# Patient Record
Sex: Female | Born: 1995 | Race: White | Hispanic: No | Marital: Married | Smoking: Former smoker
Health system: Southern US, Community
[De-identification: ages and names within clinical notes are randomized; demographics above are authoritative.]

## PROBLEM LIST (undated history)

## (undated) ENCOUNTER — Inpatient Hospital Stay (HOSPITAL_COMMUNITY): Payer: Self-pay

## (undated) DIAGNOSIS — D580 Hereditary spherocytosis: Secondary | ICD-10-CM

## (undated) DIAGNOSIS — Z9289 Personal history of other medical treatment: Secondary | ICD-10-CM

## (undated) DIAGNOSIS — D649 Anemia, unspecified: Secondary | ICD-10-CM

## (undated) HISTORY — PX: WISDOM TOOTH EXTRACTION: SHX21

---
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
## 2014-09-18 ENCOUNTER — Emergency Department (HOSPITAL_BASED_OUTPATIENT_CLINIC_OR_DEPARTMENT_OTHER)
Admission: EM | Admit: 2014-09-18 | Discharge: 2014-09-18 | Disposition: A | Discharge status: Home / Self Care | Payer: BLUE CROSS/BLUE SHIELD | Attending: Emergency Medicine | Admitting: Emergency Medicine

## 2014-09-18 ENCOUNTER — Encounter (HOSPITAL_BASED_OUTPATIENT_CLINIC_OR_DEPARTMENT_OTHER): Payer: Self-pay | Admitting: *Deleted

## 2014-09-18 ENCOUNTER — Emergency Department (HOSPITAL_BASED_OUTPATIENT_CLINIC_OR_DEPARTMENT_OTHER): Payer: BLUE CROSS/BLUE SHIELD

## 2014-09-18 DIAGNOSIS — J45909 Unspecified asthma, uncomplicated: Secondary | ICD-10-CM | POA: Insufficient documentation

## 2014-09-18 DIAGNOSIS — R1012 Left upper quadrant pain: Secondary | ICD-10-CM | POA: Diagnosis present

## 2014-09-18 DIAGNOSIS — Z79899 Other long term (current) drug therapy: Secondary | ICD-10-CM | POA: Diagnosis not present

## 2014-09-18 DIAGNOSIS — R1110 Vomiting, unspecified: Secondary | ICD-10-CM | POA: Diagnosis not present

## 2014-09-18 DIAGNOSIS — Z720 Tobacco use: Secondary | ICD-10-CM | POA: Insufficient documentation

## 2014-09-18 DIAGNOSIS — D580 Hereditary spherocytosis: Secondary | ICD-10-CM | POA: Diagnosis not present

## 2014-09-18 DIAGNOSIS — Z3202 Encounter for pregnancy test, result negative: Secondary | ICD-10-CM | POA: Diagnosis not present

## 2014-09-18 LAB — URINALYSIS, ROUTINE W REFLEX MICROSCOPIC
BILIRUBIN URINE: NEGATIVE
Glucose, UA: NEGATIVE mg/dL
Hgb urine dipstick: NEGATIVE
Ketones, ur: NEGATIVE mg/dL
LEUKOCYTES UA: NEGATIVE
Nitrite: NEGATIVE
Protein, ur: NEGATIVE mg/dL
SPECIFIC GRAVITY, URINE: 1.017 (ref 1.005–1.030)
UROBILINOGEN UA: 1 mg/dL (ref 0.0–1.0)
pH: 7 (ref 5.0–8.0)

## 2014-09-18 LAB — CBC WITH DIFFERENTIAL/PLATELET
BASOS PCT: 1 % (ref 0–1)
Basophils Absolute: 0 10*3/uL (ref 0.0–0.1)
EOS ABS: 0.2 10*3/uL (ref 0.0–0.7)
Eosinophils Relative: 2 % (ref 0–5)
HCT: 30.8 % — ABNORMAL LOW (ref 36.0–46.0)
Hemoglobin: 11.1 g/dL — ABNORMAL LOW (ref 12.0–15.0)
LYMPHS ABS: 1.1 10*3/uL (ref 0.7–4.0)
Lymphocytes Relative: 14 % (ref 12–46)
MCH: 33.9 pg (ref 26.0–34.0)
MCHC: 36 g/dL (ref 30.0–36.0)
MCV: 94.2 fL (ref 78.0–100.0)
MONOS PCT: 11 % (ref 3–12)
Monocytes Absolute: 0.9 10*3/uL (ref 0.1–1.0)
Neutro Abs: 5.5 10*3/uL (ref 1.7–7.7)
Neutrophils Relative %: 72 % (ref 43–77)
PLATELETS: 235 10*3/uL (ref 150–400)
RBC: 3.27 MIL/uL — AB (ref 3.87–5.11)
RDW: 15.3 % (ref 11.5–15.5)
WBC: 7.6 10*3/uL (ref 4.0–10.5)

## 2014-09-18 LAB — COMPREHENSIVE METABOLIC PANEL
ALBUMIN: 4.8 g/dL (ref 3.5–5.0)
ALK PHOS: 62 U/L (ref 38–126)
ALT: 14 U/L (ref 14–54)
AST: 20 U/L (ref 15–41)
Anion gap: 8 (ref 5–15)
BUN: 13 mg/dL (ref 6–20)
CHLORIDE: 105 mmol/L (ref 101–111)
CO2: 26 mmol/L (ref 22–32)
CREATININE: 0.71 mg/dL (ref 0.44–1.00)
Calcium: 9.1 mg/dL (ref 8.9–10.3)
GFR calc Af Amer: >60 mL/min (ref >60)
GLUCOSE: 115 mg/dL — AB (ref 65–99)
POTASSIUM: 3.2 mmol/L — AB (ref 3.5–5.1)
Sodium: 139 mmol/L (ref 135–145)
TOTAL PROTEIN: 7.3 g/dL (ref 6.5–8.1)
Total Bilirubin: 4.8 mg/dL — ABNORMAL HIGH (ref 0.3–1.2)

## 2014-09-18 LAB — PREGNANCY, URINE: PREG TEST UR: NEGATIVE

## 2014-09-18 MED ORDER — SODIUM CHLORIDE 0.9 % IV BOLUS (SEPSIS)
1000.0000 mL | Freq: Once | INTRAVENOUS | Status: AC
  Administered 2014-09-18: 1000 mL via INTRAVENOUS

## 2014-09-18 MED ORDER — ONDANSETRON HCL 4 MG/2ML IJ SOLN
4.0000 mg | Freq: Once | INTRAMUSCULAR | Status: AC
  Administered 2014-09-18: 4 mg via INTRAVENOUS
  Filled 2014-09-18: qty 2

## 2014-09-18 MED ORDER — KETOROLAC TROMETHAMINE 30 MG/ML IJ SOLN
30.0000 mg | Freq: Once | INTRAMUSCULAR | Status: AC
  Administered 2014-09-18: 30 mg via INTRAVENOUS
  Filled 2014-09-18: qty 1

## 2014-09-18 MED ORDER — ONDANSETRON 8 MG PO TBDP: ORAL_TABLET | ORAL | Status: DC

## 2014-09-18 NOTE — ED Notes (Signed)
Pt states she has hx of a "blood disorder" states she has a hx of spherocytosis. States that she has not felt well over the past couple days. C/o pain to left upper quad of abdomen. C/o vomiting times one today. Denies nausea at present. Denies any diarrhea or fevers.

## 2014-09-18 NOTE — ED Provider Notes (Signed)
CSN: [id number]     Arrival date & time 09/18/14  0029 History   First MD Initiated Contact with Patient 09/18/14 [phone]     Chief Complaint  Patient presents with  . Abdominal Pain     (Consider location/radiation/quality/duration/timing/severity/associated sxs/prior Treatment) Patient is a 19 y.o. female presenting with abdominal pain. The history is provided by the patient.  Abdominal Pain Pain location:  LUQ Pain quality: cramping   Pain radiates to:  Does not radiate Pain severity:  Moderate Onset quality:  Sudden Timing:  Constant Progression:  Unchanged Chronicity:  Recurrent Context: not trauma   Relieved by:  Nothing Worsened by:  Nothing tried Ineffective treatments:  None tried Associated symptoms: vomiting   Associated symptoms: no constipation, no diarrhea, no dysuria and no fever   Risk factors: not pregnant   Says this is her spherocytosis pain [facility].    Past Medical History  Diagnosis Date  . Asthma   . Spherocytosis   . History of blood transfusion 2013   History reviewed. No pertinent past surgical history. History reviewed. No pertinent family history. History  Substance Use Topics  . Smoking status: Current Some Day Smoker  . Smokeless tobacco: Not on file  . Alcohol Use: Yes     Comment: occasional    OB History    No data available     Review of Systems  Constitutional: Negative for fever.  Gastrointestinal: Positive for vomiting and abdominal pain. Negative for diarrhea and constipation.  Genitourinary: Negative for dysuria.  All other systems reviewed and are negative.     Allergies  Sulfa antibiotics  Home Medications   Prior to Admission medications   Medication Sig Start Date End Date Taking? Authorizing Provider  folic acid (FOLVITE) 1 MG tablet Take 1 mg by mouth daily.      Historical Provider, MD  ibuprofen (ADVIL,MOTRIN) 200 MG tablet Take 400 mg by mouth every 6 (six) hours as needed. pain      Historical Provider, MD  ondansetron (ZOFRAN ODT) 4 MG disintegrating tablet Take 1 tablet (4 mg total) by mouth every 8 (eight) hours as needed for nausea. 07/15/13   Eber HongBrian Miller, MD  promethazine (PHENERGAN) 25 MG tablet Take 1 tablet (25 mg total) by mouth every 6 (six) hours as needed for nausea or vomiting. 07/15/13   Eber HongBrian Miller, MD   BP 133/74 mmHg  Pulse 73  Temp(Src) 98.3 F (36.8 C) (Oral)  Resp 18  Ht 5\' 7"  (1.702 m)  Wt 134 lb 7 oz (60.98 kg)  BMI 21.05 kg/m2  SpO2 100%  LMP 09/02/2014 Physical Exam  Constitutional: She is oriented to person, place, and time. She appears well-developed and well-nourished. No distress.  HENT:  Head: Normocephalic and atraumatic.  Mouth/Throat: Oropharynx is clear and moist.  Eyes: Conjunctivae and EOM are normal. Pupils are equal, round, and reactive to light.  Neck: Normal range of motion. Neck supple.  Cardiovascular: Normal rate, regular rhythm and intact distal pulses.   Pulmonary/Chest: Effort normal and breath sounds normal. No respiratory distress. She has no wheezes. She has no rales.  Abdominal: Soft. She exhibits no mass. There is no tenderness. There is no rebound and no guarding.  Hyperactive bowel sounds spleen top normal on exam  Musculoskeletal: Normal range of motion.  Neurological: She is alert and oriented to person, place, and time.  Skin: Skin is warm and dry.  Psychiatric: She has a normal mood and affect.  ED Course  Procedures (including critical care time) Labs Review Labs Reviewed  CBC WITH DIFFERENTIAL/PLATELET - Abnormal; Notable for the following:    RBC 3.27 (*)    Hemoglobin 11.1 (*)    HCT 30.8 (*)    All other components within normal limits  COMPREHENSIVE METABOLIC PANEL - Abnormal; Notable for the following:    Potassium 3.2 (*)    Glucose, Bld 115 (*)    Total Bilirubin 4.8 (*)    All other components within normal limits  PREGNANCY, URINE  URINALYSIS, ROUTINE W REFLEX MICROSCOPIC (NOT AT  [facility])    Imaging Review Dg Abd Acute W/chest  09/18/2014   CLINICAL DATA:  Left upper quadrant abdominal pain. Cough for 3 days.  EXAM: DG ABDOMEN ACUTE W/ 1V CHEST  COMPARISON:  02/26/2008  FINDINGS: The cardiomediastinal contours are normal.  The lungs are clear.  Probable splenomegaly, splenic tip measuring 20 cm inferior to the diaphragm on supine view. There is no free intra-abdominal air. No dilated bowel loops to suggest obstruction. Small volume of stool throughout the colon. A 1.2 cm calcification is seen to the right of L2-L3, seen only on supine imaging. Incidental note of 6 non-rib-bearing lumbar vertebra, and 11 pairs of ribs. No acute osseous abnormalities are seen.  IMPRESSION: 1. Probable splenomegaly. 2. Right and 1.2 cm in the right mid/upper abdomen. This is only seen on supine imaging. Likely gallstone versus urolithiasis. 3. Clear lungs.   Electronically Signed   By: Rubye Oaks M.D.   On: 09/18/2014 05:18     EKG Interpretation None      MDM   Final diagnoses:  None   Medications  sodium chloride 0.9 % bolus 1,000 mL (1,000 mLs Intravenous New Bag/Given 09/18/14 0404)  ondansetron (ZOFRAN) injection 4 mg (4 mg Intravenous Given 09/18/14 0409)  ketorolac (TORADOL) 30 MG/ML injection 30 mg (30 mg Intravenous Given 09/18/14 0409)    Sleeping and feeling better post meds.  Follow up with your team at [facility]   Cy Blamer, MD 09/18/14 [phone]

## 2015-10-18 ENCOUNTER — Emergency Department (HOSPITAL_BASED_OUTPATIENT_CLINIC_OR_DEPARTMENT_OTHER)
Admission: EM | Admit: 2015-10-18 | Discharge: 2015-10-19 | Disposition: A | Discharge status: Home / Self Care | Payer: BLUE CROSS/BLUE SHIELD | Attending: Emergency Medicine | Admitting: Emergency Medicine

## 2015-10-18 ENCOUNTER — Encounter (HOSPITAL_BASED_OUTPATIENT_CLINIC_OR_DEPARTMENT_OTHER): Payer: Self-pay | Admitting: *Deleted

## 2015-10-18 DIAGNOSIS — S71112A Laceration without foreign body, left thigh, initial encounter: Secondary | ICD-10-CM | POA: Insufficient documentation

## 2015-10-18 DIAGNOSIS — F17200 Nicotine dependence, unspecified, uncomplicated: Secondary | ICD-10-CM | POA: Diagnosis not present

## 2015-10-18 DIAGNOSIS — Y999 Unspecified external cause status: Secondary | ICD-10-CM | POA: Diagnosis not present

## 2015-10-18 DIAGNOSIS — Y9389 Activity, other specified: Secondary | ICD-10-CM | POA: Insufficient documentation

## 2015-10-18 DIAGNOSIS — W260XXA Contact with knife, initial encounter: Secondary | ICD-10-CM | POA: Diagnosis not present

## 2015-10-18 DIAGNOSIS — J45909 Unspecified asthma, uncomplicated: Secondary | ICD-10-CM | POA: Insufficient documentation

## 2015-10-18 DIAGNOSIS — Y929 Unspecified place or not applicable: Secondary | ICD-10-CM | POA: Insufficient documentation

## 2015-10-18 DIAGNOSIS — S81812A Laceration without foreign body, left lower leg, initial encounter: Secondary | ICD-10-CM

## 2015-10-18 MED ORDER — LIDOCAINE HCL (PF) 1 % IJ SOLN
5.0000 mL | Freq: Once | INTRAMUSCULAR | Status: AC
  Administered 2015-10-19: 5 mL

## 2015-10-18 MED ORDER — LIDOCAINE HCL (PF) 1 % IJ SOLN
INTRAMUSCULAR | Status: AC
  Administered 2015-10-19: 5 mL
  Filled 2015-10-18: qty 5

## 2015-10-18 NOTE — ED Provider Notes (Signed)
CSN: [id number]     Arrival date & time 10/18/15  2229 History  By signing my name below, I, Brandi Lane, attest that this documentation has been prepared under the direction and in the presence of Brandi HatchetLisa M Philomene Haff, PA-C. Electronically Signed: Placido SouLogan Lane, ED Scribe. 10/18/2015. 11:52 PM.    Chief Complaint  Patient presents with  . Extremity Laceration   The history is provided by the patient. No language interpreter was used.    HPI Comments: Brandi Lane is a 20 y.o. female who presents to the Emergency Department complaining of a mild laceration with no active bleeding to her anterior left thigh which occurred 5 hours ago. Pt had packed a bag with kitchen supplies in her car and when reaching in her car accidentally cut the affected region on a knife protruding from the bag. She reports associated, mild, pain surrounding the wound. She states her last tetanus vaccination was ~10 years ago. Pt denies any other associated symptoms at this time.   Past Medical History  Diagnosis Date  . Asthma   . Spherocytosis (HCC)   . History of blood transfusion 2013   History reviewed. No pertinent past surgical history. History reviewed. No pertinent family history. Social History  Substance Use Topics  . Smoking status: Current Some Day Smoker  . Smokeless tobacco: None  . Alcohol Use: Yes     Comment: occasional    OB History    No data available     Review of Systems  Constitutional: Negative for fever.  Skin: Positive for wound. Negative for color change.  All other systems reviewed and are negative.  Allergies  Sulfa antibiotics  Home Medications   Prior to Admission medications   Medication Sig Start Date End Date Taking? Authorizing Provider  folic acid (FOLVITE) 1 MG tablet Take 1 mg by mouth daily.      Historical Provider, MD  ibuprofen (ADVIL,MOTRIN) 200 MG tablet Take 400 mg by mouth every 6 (six) hours as needed. pain     Historical Provider, MD   BP  123/66 mmHg  Pulse 77  Temp(Src) 98.5 F (36.9 C) (Oral)  Resp 16  Ht 5\' 7"  (1.702 m)  Wt 135 lb (61.236 kg)  BMI 21.14 kg/m2  SpO2 100%    Physical Exam  Constitutional: She is oriented to person, place, and time. She appears well-developed and well-nourished.  HENT:  Head: Normocephalic and atraumatic.  Mouth/Throat: Oropharynx is clear and moist.  Eyes: Conjunctivae and EOM are normal. Pupils are equal, round, and reactive to light.  Neck: Normal range of motion.  Cardiovascular: Normal rate, regular rhythm and normal heart sounds.   Pulmonary/Chest: Effort normal and breath sounds normal. No respiratory distress.  Abdominal: Soft. Bowel sounds are normal.  Musculoskeletal: Normal range of motion.  3cm semi-circular laceration to left anterior thigh without active bleeding, wound overall superificial without deep tissue or vessel involvement,   Neurological: She is alert and oriented to person, place, and time.  Skin: Skin is warm and dry.  Psychiatric: She has a normal mood and affect.  Nursing note and vitals reviewed.   ED Course  Procedures   LACERATION REPAIR Performed by: Brandi HatchetSANDERS, Chastin Riesgo M Authorized by: Brandi HatchetSANDERS, Dontrell Stuck M Consent: Verbal consent obtained. Risks and benefits: risks, benefits and alternatives were discussed Consent given by: patient Patient identity confirmed: provided demographic data Prepped and Draped in normal sterile fashion Wound explored  Laceration Location: left anterior thigh  Laceration Length: 3 cm  No  Foreign Bodies seen or palpated  Anesthesia: local infiltration  Local anesthetic: lidocaine 1% without epinephrine  Anesthetic total: 5 ml  Irrigation method: syringe Amount of cleaning: standard  Skin closure: 4-0 prolene  Number of sutures: 3  Technique: simple interrupted  Patient tolerance: Patient tolerated the procedure well with no immediate complications.  DIAGNOSTIC STUDIES: Oxygen Saturation is 100% on RA,  normal by my interpretation.    COORDINATION OF CARE: 11:51 PM Discussed next steps with pt. Pt verbalized understanding and is agreeable with the plan.   Labs Review Labs Reviewed - No data to display  Imaging Review No results found.   EKG Interpretation None      MDM   Final diagnoses:  Leg laceration, left, initial encounter   20 year old female here with a laceration to left anterior thigh from knife while packing up car. This was accidental. Wound is overall superficial, approximately 3 cm in length in a semicircular fashion. There is no evidence of deep tissue or vessel involvement. Wound is hemostatic at this time. Tetanus updated. Laceration repaired as above, patient tolerated well. Follow-up with PCP in one week for suture removal.  Discussed plan with patient, he/she acknowledged understanding and agreed with plan of care.  Return precautions given for new or worsening symptoms.  I personally performed the services described in this documentation, which was scribed in my presence. The recorded information has been reviewed and is accurate.  Brandi HatchetLisa M Izekiel Flegel, PA-C 10/19/15 [phone]  Paula LibraJohn Molpus, MD 10/19/15 [phone]

## 2015-10-18 NOTE — ED Notes (Addendum)
Pt c/o accidental laceration to left thigh x 5 hrs ago by [employer] knife

## 2015-10-19 MED ORDER — TETANUS-DIPHTH-ACELL PERTUSSIS 5-2.5-18.5 LF-MCG/0.5 IM SUSP
0.5000 mL | Freq: Once | INTRAMUSCULAR | Status: AC
  Administered 2015-10-19: 0.5 mL via INTRAMUSCULAR
  Filled 2015-10-19: qty 0.5

## 2015-10-19 NOTE — Discharge Instructions (Signed)
Keep sutures clean and dry. They will need to be removed in 1 week.  Your primary care doctor can do this for you. Return here for new concerns.

## 2015-11-24 DIAGNOSIS — J342 Deviated nasal septum: Secondary | ICD-10-CM | POA: Insufficient documentation

## 2015-11-24 DIAGNOSIS — R0683 Snoring: Secondary | ICD-10-CM | POA: Insufficient documentation

## 2015-11-24 DIAGNOSIS — J343 Hypertrophy of nasal turbinates: Secondary | ICD-10-CM | POA: Insufficient documentation

## 2016-08-02 ENCOUNTER — Ambulatory Visit (HOSPITAL_COMMUNITY)
Admission: EM | Admit: 2016-08-02 | Discharge: 2016-08-02 | Disposition: A | Discharge status: Nursing Home (Includes ICF) | Payer: BLUE CROSS/BLUE SHIELD | Attending: Family Medicine | Admitting: Family Medicine

## 2016-08-02 ENCOUNTER — Ambulatory Visit (HOSPITAL_COMMUNITY): Payer: BLUE CROSS/BLUE SHIELD

## 2016-08-02 ENCOUNTER — Emergency Department (HOSPITAL_COMMUNITY)
Admission: EM | Admit: 2016-08-02 | Discharge: 2016-08-03 | Disposition: A | Discharge status: Home / Self Care | Payer: 59 | Attending: Emergency Medicine | Admitting: Emergency Medicine

## 2016-08-02 ENCOUNTER — Encounter (HOSPITAL_COMMUNITY): Payer: Self-pay

## 2016-08-02 ENCOUNTER — Encounter (HOSPITAL_COMMUNITY): Payer: Self-pay | Admitting: Emergency Medicine

## 2016-08-02 DIAGNOSIS — F17200 Nicotine dependence, unspecified, uncomplicated: Secondary | ICD-10-CM | POA: Diagnosis not present

## 2016-08-02 DIAGNOSIS — R1012 Left upper quadrant pain: Secondary | ICD-10-CM

## 2016-08-02 DIAGNOSIS — R161 Splenomegaly, not elsewhere classified: Secondary | ICD-10-CM | POA: Insufficient documentation

## 2016-08-02 DIAGNOSIS — J45909 Unspecified asthma, uncomplicated: Secondary | ICD-10-CM | POA: Diagnosis not present

## 2016-08-02 LAB — CBC
HEMATOCRIT: 35 % — AB (ref 36.0–46.0)
HEMOGLOBIN: 12.7 g/dL (ref 12.0–15.0)
MCH: 32.8 pg (ref 26.0–34.0)
MCHC: 36.3 g/dL — ABNORMAL HIGH (ref 30.0–36.0)
MCV: 90.4 fL (ref 78.0–100.0)
Platelets: 222 10*3/uL (ref 150–400)
RBC: 3.87 MIL/uL (ref 3.87–5.11)
RDW: 13.9 % (ref 11.5–15.5)
WBC: 8.5 10*3/uL (ref 4.0–10.5)

## 2016-08-02 LAB — COMPREHENSIVE METABOLIC PANEL
ALT: 14 U/L (ref 14–54)
ANION GAP: 10 (ref 5–15)
AST: 19 U/L (ref 15–41)
Albumin: 4.8 g/dL (ref 3.5–5.0)
Alkaline Phosphatase: 66 U/L (ref 38–126)
BUN: 10 mg/dL (ref 6–20)
CHLORIDE: 104 mmol/L (ref 101–111)
CO2: 23 mmol/L (ref 22–32)
CREATININE: 0.72 mg/dL (ref 0.44–1.00)
Calcium: 9.7 mg/dL (ref 8.9–10.3)
GFR calc Af Amer: >60 mL/min (ref >60)
GFR calc non Af Amer: >60 mL/min (ref >60)
Glucose, Bld: 105 mg/dL — ABNORMAL HIGH (ref 65–99)
POTASSIUM: 3.2 mmol/L — AB (ref 3.5–5.1)
SODIUM: 137 mmol/L (ref 135–145)
Total Bilirubin: 4.7 mg/dL — ABNORMAL HIGH (ref 0.3–1.2)
Total Protein: 6.9 g/dL (ref 6.5–8.1)

## 2016-08-02 LAB — I-STAT BETA HCG BLOOD, ED (MC, WL, AP ONLY): I-stat hCG, quantitative: <5 m[IU]/mL (ref <5)

## 2016-08-02 LAB — URINALYSIS, ROUTINE W REFLEX MICROSCOPIC
Bilirubin Urine: NEGATIVE
GLUCOSE, UA: NEGATIVE mg/dL
Hgb urine dipstick: NEGATIVE
Ketones, ur: NEGATIVE mg/dL
Leukocytes, UA: NEGATIVE
Nitrite: NEGATIVE
PH: 5 (ref 5.0–8.0)
Protein, ur: NEGATIVE mg/dL
SPECIFIC GRAVITY, URINE: 1.004 — AB (ref 1.005–1.030)

## 2016-08-02 LAB — LIPASE, BLOOD: LIPASE: 20 U/L (ref 11–51)

## 2016-08-02 NOTE — ED Provider Notes (Signed)
CSN: [id number]     Arrival date & time 08/02/16  1920 History   First MD Initiated Contact with Patient 08/02/16 1946     Chief Complaint  Patient presents with  . Abdominal Pain   (Consider location/radiation/quality/duration/timing/severity/associated sxs/prior Treatment) PCP: Dr. Hyacinth Meeker at [facility]. Sees PCP once a year however she is due for her physical. She also sees her adult hematologist annual at [facility] given her history of Spherocytosis.   Patient is here today for abdominal pain onset suddenly today at the LUQ and radiates to the back. Pain is describe as a sharp pain. She also feel nauseous and have been throwing up. She denies diarrhea. She does reports feeling very weak. She have had hx of splenomegaly due to her spherocytosis where she had to get blood transfusion in the past.   She denies vaginal discharge. She denies any urinary symptoms. She is on Mirena for contraception.       Past Medical History:  Diagnosis Date  . Asthma   . History of blood transfusion 2013  . Spherocytosis (HCC)    History reviewed. No pertinent surgical history. No family history on file. Social History  Substance Use Topics  . Smoking status: Current Some Day Smoker  . Smokeless tobacco: Never Used  . Alcohol use Yes     Comment: occasional    OB History    No data available     Review of Systems  Constitutional:       As stated in the HPI    Allergies  Sulfa antibiotics  Home Medications   Prior to Admission medications   Medication Sig Start Date End Date Taking? Authorizing Provider  levonorgestrel (MIRENA) 20 MCG/24HR IUD 1 each by Intrauterine route once.   Yes Historical Provider, MD  folic acid (FOLVITE) 1 MG tablet Take 1 mg by mouth daily.      Historical Provider, MD  ibuprofen (ADVIL,MOTRIN) 200 MG tablet Take 400 mg by mouth every 6 (six) hours as needed. pain     Historical Provider, MD   Meds Ordered and Administered this Visit  Medications -  No data to display  BP 105/65 (BP Location: Left Arm)   Pulse 81   Temp 99.3 F (37.4 C) (Oral)   Resp 20   LMP 08/01/2016   SpO2 99%  No data found.   Physical Exam  Constitutional: She is oriented to person, place, and time. She appears well-developed and well-nourished.  Cardiovascular: Normal rate, regular rhythm and normal heart sounds.   Pulmonary/Chest: Effort normal and breath sounds normal. She has no wheezes.  Abdominal: Soft. Bowel sounds are normal. She exhibits no distension. There is tenderness.  Has severe tenderness on palpation over the LUQ area and LLQ area.   Neurological: She is alert and oriented to person, place, and time.  Skin: Skin is warm and dry.  Nursing note and vitals reviewed.   Urgent Care Course     Procedures (including critical care time)  Labs Review Labs Reviewed - No data to display  Imaging Review No results found.   MDM   1. Left upper quadrant pain    Has concern for splenomegaly given her history of spherocytosis. I feel uncomfortable discharging patient home without knowing what her CBC shows and due to the severity and location of her pain. Patient send to ER for further evaluation.     Lucia Estelle, NP 08/02/16 2055

## 2016-08-02 NOTE — ED Triage Notes (Signed)
Here for LUQ pain onset 1700 today associated w/weakness, fatigue, nauseas, vomiting  Denies fevers, urinary sx  A&O x4...  NAD

## 2016-08-02 NOTE — ED Triage Notes (Signed)
Pt endorses LUQ pain with n/v that began today. Pt seen at [facility] and sent here for blood work. Pt has spherocytosis and worried that something is wrong with her spleen. VSS. Denies n/v at this time.

## 2016-08-03 ENCOUNTER — Emergency Department (HOSPITAL_COMMUNITY): Payer: 59

## 2016-08-03 ENCOUNTER — Encounter (HOSPITAL_COMMUNITY): Payer: Self-pay | Admitting: Radiology

## 2016-08-03 DIAGNOSIS — R161 Splenomegaly, not elsewhere classified: Secondary | ICD-10-CM | POA: Diagnosis not present

## 2016-08-03 LAB — DIFFERENTIAL
Basophils Absolute: 0 10*3/uL (ref 0.0–0.1)
Basophils Relative: 0 %
Eosinophils Absolute: 0.5 10*3/uL (ref 0.0–0.7)
Eosinophils Relative: 6 %
LYMPHS ABS: 0.4 10*3/uL — AB (ref 0.7–4.0)
LYMPHS PCT: 4 %
MONO ABS: 0.4 10*3/uL (ref 0.1–1.0)
Monocytes Relative: 5 %
NEUTROS ABS: 7.2 10*3/uL (ref 1.7–7.7)
Neutrophils Relative %: 85 %

## 2016-08-03 MED ORDER — SODIUM CHLORIDE 0.9 % IV BOLUS (SEPSIS)
1000.0000 mL | Freq: Once | INTRAVENOUS | Status: AC
  Administered 2016-08-03: 1000 mL via INTRAVENOUS

## 2016-08-03 MED ORDER — IOPAMIDOL (ISOVUE-300) INJECTION 61%
INTRAVENOUS | Status: AC
  Administered 2016-08-03: 100 mL
  Filled 2016-08-03: qty 100

## 2016-08-03 NOTE — ED Notes (Signed)
Patient transported to CT 

## 2016-08-03 NOTE — ED Notes (Signed)
Pt understood dc material. NAD noted. 

## 2016-08-03 NOTE — Discharge Instructions (Signed)
Follow up with your doctor (either with [facility] or with your doctor at [facility]) on Monday for recheck of abdominal pain and low blood pressure. Push fluids. Return to the emergency department with any worsening symptoms or new concerns.

## 2016-08-03 NOTE — ED Provider Notes (Signed)
[facility]-EMERGENCY DEPT Provider Note   CSN: [id number] Arrival date & time: 08/02/16  2018     History   Chief Complaint Chief Complaint  Patient presents with  . Abdominal Pain    HPI Brandi Lane is a 21 y.o. female.  Patient with a history of Hereditary Spherocytosis presents with LUQ abdominal pain that started earlier today. It is episodic and cramping in nature. There has been associated nausea and vomiting today. She denies fever or diarrhea. He has known splenomegaly and is followed by hem/onc at [facility], most recent appointment 05/21/16 to establish adult care from previous pediatric care. The pain does not radiate. She has no cough or SOB. The LUQ discomfort is not made worse with eating or with deep breathing.    The history is provided by the patient. No language interpreter was used.  Abdominal Pain   Associated symptoms include nausea and vomiting. Pertinent negatives include fever.    Past Medical History:  Diagnosis Date  . Asthma   . History of blood transfusion 2013  . Spherocytosis (HCC)     There are no active problems to display for this patient.   History reviewed. No pertinent surgical history.  OB History    No data available       Home Medications    Prior to Admission medications   Medication Sig Start Date End Date Taking? Authorizing Provider  FLUoxetine (PROZAC) 20 MG capsule Take 20 mg by mouth daily. 05/05/16  Yes Historical Provider, MD  folic acid (FOLVITE) 1 MG tablet Take 1 mg by mouth daily.     Yes Historical Provider, MD  levonorgestrel (MIRENA) 20 MCG/24HR IUD 1 each by Intrauterine route once.   Yes Historical Provider, MD    Family History History reviewed. No pertinent family history.  Social History Social History  Substance Use Topics  . Smoking status: Current Some Day Smoker  . Smokeless tobacco: Never Used  . Alcohol use Yes     Comment: occasional      Allergies   Sulfa antibiotics   Review of  Systems Review of Systems  Constitutional: Negative for chills and fever.  Respiratory: Negative.  Negative for shortness of breath.   Cardiovascular: Negative.  Negative for chest pain.  Gastrointestinal: Positive for abdominal pain, nausea and vomiting.  Genitourinary: Negative.  Negative for pelvic pain and vaginal bleeding.  Musculoskeletal: Negative.  Negative for back pain.  Skin: Negative.  Negative for color change.  Neurological: Negative.  Negative for weakness and light-headedness.     Physical Exam Updated Vital Signs BP 97/63   Pulse 69   Temp 98.4 F (36.9 C) (Oral)   Resp 16   Ht 5' 7.5" (1.715 m)   Wt 62.6 kg   LMP 08/01/2016   SpO2 100%   BMI 21.29 kg/m   Physical Exam  Constitutional: She is oriented to person, place, and time. She appears well-developed and well-nourished.  HENT:  Head: Normocephalic.  Eyes: No scleral icterus.  Neck: Normal range of motion. Neck supple.  Cardiovascular: Normal rate and regular rhythm.   Pulmonary/Chest: Effort normal and breath sounds normal. She has no wheezes. She has no rales.  Abdominal: Soft. Bowel sounds are normal. There is tenderness (LUQ tenderness and less to the epigastrium. ). There is no rebound and no guarding.  No discernable hepatosplenomegaly.   Musculoskeletal: Normal range of motion.  Neurological: She is alert and oriented to person, place, and time.  Skin: Skin is warm and dry.  No rash noted.  Psychiatric: She has a normal mood and affect.     ED Treatments / Results  Labs (all labs ordered are listed, but only abnormal results are displayed) Labs Reviewed  COMPREHENSIVE METABOLIC PANEL - Abnormal; Notable for the following:       Result Value   Potassium 3.2 (*)    Glucose, Bld 105 (*)    Total Bilirubin 4.7 (*)    All other components within normal limits  CBC - Abnormal; Notable for the following:    HCT 35.0 (*)    MCHC 36.3 (*)    All other components within normal limits   URINALYSIS, ROUTINE W REFLEX MICROSCOPIC - Abnormal; Notable for the following:    Color, Urine STRAW (*)    Specific Gravity, Urine 1.004 (*)    All other components within normal limits  LIPASE, BLOOD  DIFFERENTIAL  I-STAT BETA HCG BLOOD, ED ([facility], [facility], [facility] ONLY)    EKG  EKG Interpretation None       Radiology Dg Abdomen Acute W/chest  Result Date: 08/03/2016 CLINICAL DATA:  Acute onset of left upper quadrant abdominal pain and vomiting. Initial encounter. EXAM: DG ABDOMEN ACUTE W/ 1V CHEST COMPARISON:  Chest and abdominal radiographs performed 09/18/2014 FINDINGS: The lungs are well-aerated and clear. There is no evidence of focal opacification, pleural effusion or pneumothorax. The cardiomediastinal silhouette is within normal limits. The visualized bowel gas pattern is unremarkable. Scattered stool and air are seen within the colon; there is no evidence of small bowel dilatation to suggest obstruction. No free intra-abdominal air is identified on the provided upright view. No acute osseous abnormalities are seen; the sacroiliac joints are unremarkable in appearance. An intrauterine device is noted overlying the mid pelvis. IMPRESSION: 1. Unremarkable bowel gas pattern; no free intra-abdominal air seen. Small amount of stool noted in the colon. 2. No acute cardiopulmonary process seen. Electronically Signed   By: Roanna Raider M.D.   On: 08/03/2016 01:10    Procedures Procedures (including critical care time)  Medications Ordered in ED Medications  sodium chloride 0.9 % bolus 1,000 mL (1,000 mLs Intravenous New Bag/Given 08/03/16 0151)     Initial Impression / Assessment and Plan / ED Course  I have reviewed the triage vital signs and the nursing notes.  Pertinent labs & imaging results that were available during my care of the patient were reviewed by me and considered in my medical decision making (see chart for details).     Patient with LUQ abdominal pain, nausea and  vomiting. Pain and nausea have subsided through the day but she reports pain was severe at its worst earlier today. She has a history of hereditary spherocytosis and known splenomegaly. She is here with concern for splenic infarct. Normal Hgb.   Chart reviewed. Splenomegaly measures approximately 20 cm, c/w finding today. No definite infarct seen. The patient continues to feel better over time. Her blood pressure has been running low, 90's systolic. IVF's running.   Anticipate discharge home when blood pressure improved.   She is given a total of 2L fluids. Blood pressure improved to systolic 104. She ambulates to the bathroom without symptoms of lightheadedness or dizziness. Feel she can discharged home with close PCP follow up.  Final Clinical Impressions(s) / ED Diagnoses   Final diagnoses:  None   1. LUQ abdominal pain 2. Splenomegaly   New Prescriptions New Prescriptions   No medications on file     Elpidio Anis, PA-C 08/03/16 1610  Gilda Crease, MD 08/03/16 [phone]

## 2016-08-03 NOTE — ED Notes (Signed)
Patient transported to X-ray 

## 2017-12-11 ENCOUNTER — Ambulatory Visit (INDEPENDENT_AMBULATORY_CARE_PROVIDER_SITE_OTHER): Payer: BLUE CROSS/BLUE SHIELD | Admitting: Obstetrics & Gynecology

## 2017-12-11 ENCOUNTER — Encounter: Payer: Self-pay | Admitting: Obstetrics & Gynecology

## 2017-12-11 DIAGNOSIS — Z3400 Encounter for supervision of normal first pregnancy, unspecified trimester: Secondary | ICD-10-CM | POA: Insufficient documentation

## 2017-12-11 DIAGNOSIS — Z3687 Encounter for antenatal screening for uncertain dates: Secondary | ICD-10-CM

## 2017-12-11 DIAGNOSIS — Z3401 Encounter for supervision of normal first pregnancy, first trimester: Secondary | ICD-10-CM

## 2017-12-11 DIAGNOSIS — Z113 Encounter for screening for infections with a predominantly sexual mode of transmission: Secondary | ICD-10-CM | POA: Diagnosis not present

## 2017-12-11 DIAGNOSIS — D580 Hereditary spherocytosis: Secondary | ICD-10-CM

## 2017-12-11 NOTE — Progress Notes (Signed)
  Subjective:    Brett CanalesJacqueline B Knight is being seen today for her first obstetrical visit. LMP 10/16/2017 This is not a planned pregnancy. But, very desired. She is at 8110w0d gestation. Her obstetrical history is significant for early THC adn light ETHO use. . Relationship with FOB: significant other, living together. Patient does intend to breast feed. Pregnancy history fully reviewed. Pt smoked THC recreationally last use 3-4 weeks prev. No use since then. Last ETOH was a glass of wine early.  Pt is a bar tender.  Light ETOH  Father:  hereditary sphereocytosis    Patient reports no complaints.  Review of Systems:   Review of Systems: Keyser  Objective:     BP 128/63   Pulse 87   LMP 10/16/2017 (Exact Date)  Physical Exam  Exam General Appearance:    Alert, cooperative, no distress, appears stated age  Head:    Normocephalic, without obvious abnormality, atraumatic  Eyes:    conjunctiva/corneas clear, EOM's intact, both eyes  Ears:    Normal external ear canals, both ears  Nose:   Nares normal, septum midline, mucosa normal, no drainage    or sinus tenderness  Throat:   Lips, mucosa, and tongue normal; teeth and gums normal  Neck:   Supple, symmetrical, trachea midline, no adenopathy;    thyroid:  no enlargement/tenderness/nodules  Back:     Symmetric, no curvature, ROM normal, no CVA tenderness  Lungs:     Clear to auscultation bilaterally, respirations unlabored  Chest Wall:    No tenderness or deformity   Heart:    Regular rate and rhythm, S1 and S2 normal, no murmur, rub   or gallop  Breast Exam:    No tenderness, masses, or nipple abnormality  Abdomen:     Soft, non-tender, bowel sounds active all four quadrants,    no masses, no organomegaly  Genitalia:    Normal female without lesion, discharge or tenderness; uterus enlarge 9 weeks     Extremities:   Extremities normal, atraumatic, no cyanosis or edema  Pulses:   2+ and symmetric all extremities  Skin:   Skin color, texture,  turgor normal, no rashes or lesions   Assessment:    Pregnancy: G1P0 Patient Active Problem List   Diagnosis Date Noted  . Supervision of normal first pregnancy, antepartum 12/11/2017  . Spherocytosis (familial) (HCC) 12/11/2017       Plan:     Initial labs drawn. Prenatal vitamins. Problem list reviewed and updated. AFP3 discussed: requested. Role of ultrasound in pregnancy discussed; fetal survey: requested. Amniocentesis discussed: not indicated. Follow up in 4 weeks. 60% of 70 min visit spent on counseling and coordination of care.    Willodean RosenthalCarolyn Harraway-Smith 12/11/2017

## 2017-12-11 NOTE — Addendum Note (Signed)
Addended by: Anell BarrHOWARD, JENNIFER L on: 12/11/2017 12:18 PM   Modules accepted: Orders

## 2017-12-11 NOTE — Progress Notes (Signed)
DATING AND VIABILITY SONOGRAM   Brett CanalesJacqueline B Lane is a 22 y.o. year old G1P0 with LMP Patient's last menstrual period was 10/16/2017 (exact date). which would correlate to  1155w0d weeks gestation.  She has regular menstrual cycles.   She is here today for a confirmatory initial sonogram.    GESTATION: SINGLETON     FETAL ACTIVITY:          Heart rate       175          The fetus is active.  ADNEXA: The ovaries are normal.   GESTATIONAL AGE AND  BIOMETRICS:  Gestational criteria: Estimated Date of Delivery: 07/23/18 by LMP now at 2855w0d  Previous Scans:0      CROWN RUMP LENGTH           1.71 cm       8-1weeks                                                                               AVERAGE EGA(BY THIS SCAN):  8-1weeks  WORKING EDD( LMP ):  07-23-2018     Armandina StammerJennifer Sarye Kath 12/11/2017 9:37 AM

## 2017-12-11 NOTE — Patient Instructions (Signed)
First Trimester of Pregnancy The first trimester of pregnancy is from week 1 until the end of week 13 (months 1 through 3). During this time, your baby will begin to develop inside you. At 6-8 weeks, the eyes and face are formed, and the heartbeat can be seen on ultrasound. At the end of 12 weeks, all the baby's organs are formed. Prenatal care is all the medical care you receive before the birth of your baby. Make sure you get good prenatal care and follow all of your doctor's instructions. Follow these instructions at home: Medicines  Take over-the-counter and prescription medicines only as told by your doctor. Some medicines are safe and some medicines are not safe during pregnancy.  Take a prenatal vitamin that contains at least 600 micrograms (mcg) of folic acid.  If you have trouble pooping (constipation), take medicine that will make your stool soft (stool softener) if your doctor approves. Eating and drinking  Eat regular, healthy meals.  Your doctor will tell you the amount of weight gain that is right for you.  Avoid raw meat and uncooked cheese.  If you feel sick to your stomach (nauseous) or throw up (vomit): ? Eat 4 or 5 small meals a day instead of 3 large meals. ? Try eating a few soda crackers. ? Drink liquids between meals instead of during meals.  To prevent constipation: ? Eat foods that are high in fiber, like fresh fruits and vegetables, whole grains, and beans. ? Drink enough fluids to keep your pee (urine) clear or pale yellow. Activity  Exercise only as told by your doctor. Stop exercising if you have cramps or pain in your lower belly (abdomen) or low back.  Do not exercise if it is too hot, too humid, or if you are in a place of great height (high altitude).  Try to avoid standing for long periods of time. Move your legs often if you must stand in one place for a long time.  Avoid heavy lifting.  Wear low-heeled shoes. Sit and stand up straight.  You  can have sex unless your doctor tells you not to. Relieving pain and discomfort  Wear a good support bra if your breasts are sore.  Take warm water baths (sitz baths) to soothe pain or discomfort caused by hemorrhoids. Use hemorrhoid cream if your doctor says it is okay.  Rest with your legs raised if you have leg cramps or low back pain.  If you have puffy, bulging veins (varicose veins) in your legs: ? Wear support hose or compression stockings as told by your doctor. ? Raise (elevate) your feet for 15 minutes, 3-4 times a day. ? Limit salt in your food. Prenatal care  Schedule your prenatal visits by the twelfth week of pregnancy.  Write down your questions. Take them to your prenatal visits.  Keep all your prenatal visits as told by your doctor. This is important. Safety  Wear your seat belt at all times when driving.  Make a list of emergency phone numbers. The list should include numbers for family, friends, the hospital, and police and fire departments. General instructions  Ask your doctor for a referral to a local prenatal class. Begin classes no later than at the start of month 6 of your pregnancy.  Ask for help if you need counseling or if you need help with nutrition. Your doctor can give you advice or tell you where to go for help.  Do not use hot tubs, steam rooms, or   saunas.  Do not douche or use tampons or scented sanitary pads.  Do not cross your legs for long periods of time.  Avoid all herbs and alcohol. Avoid drugs that are not approved by your doctor.  Do not use any tobacco products, including cigarettes, chewing tobacco, and electronic cigarettes. If you need help quitting, ask your doctor. You may get counseling or other support to help you quit.  Avoid cat litter boxes and soil used by cats. These carry germs that can cause birth defects in the baby and can cause a loss of your baby (miscarriage) or stillbirth.  Visit your dentist. At home, brush  your teeth with a soft toothbrush. Be gentle when you floss. Contact a doctor if:  You are dizzy.  You have mild cramps or pressure in your lower belly.  You have a nagging pain in your belly area.  You continue to feel sick to your stomach, you throw up, or you have watery poop (diarrhea).  You have a bad smelling fluid coming from your vagina.  You have pain when you pee (urinate).  You have increased puffiness (swelling) in your face, hands, legs, or ankles. Get help right away if:  You have a fever.  You are leaking fluid from your vagina.  You have spotting or bleeding from your vagina.  You have very bad belly cramping or pain.  You gain or lose weight rapidly.  You throw up blood. It may look like coffee grounds.  You are around people who have German measles, fifth disease, or chickenpox.  You have a very bad headache.  You have shortness of breath.  You have any kind of trauma, such as from a fall or a car accident. Summary  The first trimester of pregnancy is from week 1 until the end of week 13 (months 1 through 3).  To take care of yourself and your unborn baby, you will need to eat healthy meals, take medicines only if your doctor tells you to do so, and do activities that are safe for you and your baby.  Keep all follow-up visits as told by your doctor. This is important as your doctor will have to ensure that your baby is healthy and growing well. This information is not intended to replace advice given to you by your health care provider. Make sure you discuss any questions you have with your health care provider. Document Released: 09/17/2007 Document Revised: 04/08/2016 Document Reviewed: 04/08/2016 Elsevier Interactive Patient Education  2017 [employer]  

## 2017-12-12 LAB — OBSTETRIC PANEL, INCLUDING HIV
Antibody Screen: NEGATIVE
Basophils Absolute: 0.1 10*3/uL (ref 0.0–0.2)
Basos: 1 %
EOS (ABSOLUTE): 0.1 10*3/uL (ref 0.0–0.4)
Eos: 2 %
HEMATOCRIT: 31 % — AB (ref 34.0–46.6)
HEMOGLOBIN: 11 g/dL — AB (ref 11.1–15.9)
HIV Screen 4th Generation wRfx: NONREACTIVE
Hepatitis B Surface Ag: NEGATIVE
IMMATURE GRANULOCYTES: 1 %
Immature Grans (Abs): 0 10*3/uL (ref 0.0–0.1)
LYMPHS ABS: 1.1 10*3/uL (ref 0.7–3.1)
LYMPHS: 14 %
MCH: 34 pg — ABNORMAL HIGH (ref 26.6–33.0)
MCHC: 35.5 g/dL (ref 31.5–35.7)
MCV: 96 fL (ref 79–97)
MONOS ABS: 0.6 10*3/uL (ref 0.1–0.9)
Monocytes: 8 %
NEUTROS PCT: 74 %
Neutrophils Absolute: 5.8 10*3/uL (ref 1.4–7.0)
Platelets: 261 10*3/uL (ref 150–450)
RBC: 3.24 x10E6/uL — AB (ref 3.77–5.28)
RDW: 14.8 % (ref 12.3–15.4)
RPR Ser Ql: NONREACTIVE
Rh Factor: NEGATIVE
Rubella Antibodies, IGG: 3.38 index (ref >0.99)
WBC: 7.6 10*3/uL (ref 3.4–10.8)

## 2017-12-13 LAB — URINE CULTURE, OB REFLEX: Organism ID, Bacteria: NO GROWTH

## 2017-12-13 LAB — SPECIMEN STATUS REPORT

## 2017-12-13 LAB — CULTURE, OB URINE

## 2017-12-15 LAB — GC/CHLAMYDIA PROBE AMP (~~LOC~~) NOT AT ARMC
Chlamydia: NEGATIVE
NEISSERIA GONORRHEA: NEGATIVE

## 2017-12-22 ENCOUNTER — Telehealth: Payer: Self-pay

## 2017-12-22 NOTE — Telephone Encounter (Signed)
Patient called and made aware of low iron levels. Patient informed per Dr. Erin Fulling she needs to take iron twice a day with a glass of orange juice for better absorption. Patient also made aware to avoid taking it at the same time as her prenatal vitamin. Brandi Stammer RN

## 2017-12-23 LAB — SMN1 COPY NUMBER ANALYSIS (SMA CARRIER SCREENING)

## 2017-12-23 LAB — CYSTIC FIBROSIS GENE TEST

## 2017-12-23 LAB — SPECIMEN STATUS REPORT

## 2017-12-23 LAB — HEMOGLOBINOPATHY EVALUATION
HEMOGLOBIN A2 QUANTITATION: 2.2 % (ref 1.8–3.2)
HGB C: 0 %
HGB S: 0 %
HGB VARIANT: 0 %
Hemoglobin F Quantitation: 1.5 % (ref 0.0–2.0)
Hgb A: 96.3 % — ABNORMAL LOW (ref 96.4–98.8)

## 2018-01-08 ENCOUNTER — Ambulatory Visit (INDEPENDENT_AMBULATORY_CARE_PROVIDER_SITE_OTHER): Payer: BLUE CROSS/BLUE SHIELD | Admitting: Family Medicine

## 2018-01-08 ENCOUNTER — Encounter: Payer: Self-pay | Admitting: Family Medicine

## 2018-01-08 VITALS — BP 115/64 | HR 80 | Wt 147.0 lb

## 2018-01-08 DIAGNOSIS — Z3400 Encounter for supervision of normal first pregnancy, unspecified trimester: Secondary | ICD-10-CM

## 2018-01-08 DIAGNOSIS — O26899 Other specified pregnancy related conditions, unspecified trimester: Secondary | ICD-10-CM

## 2018-01-08 DIAGNOSIS — Z6791 Unspecified blood type, Rh negative: Secondary | ICD-10-CM

## 2018-01-08 NOTE — Progress Notes (Signed)
   PRENATAL VISIT NOTE  Subjective:  Brandi Lane is a 22 y.o. G1P0 at [redacted]w[redacted]d being seen today for ongoing prenatal care.  She is currently monitored for the following issues for this low-risk pregnancy and has Supervision of normal first pregnancy, antepartum; Spherocytosis (familial) (HCC); and Rh negative state in antepartum period on their problem list.  Patient reports no complaints.  Contractions: Not present. Vag. Bleeding: None.  Movement: Absent. Denies leaking of fluid.   The following portions of the patient's history were reviewed and updated as appropriate: allergies, current medications, past family history, past medical history, past social history, past surgical history and problem list. Problem list updated.  Objective:   Vitals:   01/08/18 0849  BP: 115/64  Pulse: 80  Weight: 147 lb (66.7 kg)    Fetal Status: Fetal Heart Rate (bpm): 156   Movement: Absent     General:  Alert, oriented and cooperative. Patient is in no acute distress.  Skin: Skin is warm and dry. No rash noted.   Cardiovascular: Normal heart rate noted  Respiratory: Normal respiratory effort, no problems with respiration noted  Abdomen: Soft, gravid, appropriate for gestational age.  Pain/Pressure: Absent     Pelvic: Cervical exam deferred        Extremities: Normal range of motion.  Edema: None  Mental Status: Normal mood and affect. Normal behavior. Normal judgment and thought content.   Assessment and Plan:  Pregnancy: G1P0 at [redacted]w[redacted]d  1. Supervision of normal first pregnancy, antepartum Declined genetic testing for now. FHT normal   2. Rh negative state in antepartum period Rhogam at 28 weeks discussed.  Preterm labor symptoms and general obstetric precautions including but not limited to vaginal bleeding, contractions, leaking of fluid and fetal movement were reviewed in detail with the patient. Please refer to After Visit Summary for other counseling recommendations.  No follow-ups  on file.  No future appointments.  Levie Heritage, DO

## 2018-01-08 NOTE — Progress Notes (Signed)
Pt states she had bright red bleeding this moring when using the rest room, pt states it was not from vagina, it was from her bottom.

## 2018-01-31 ENCOUNTER — Encounter (HOSPITAL_COMMUNITY): Payer: Self-pay

## 2018-02-01 ENCOUNTER — Telehealth: Payer: Self-pay

## 2018-02-01 NOTE — Telephone Encounter (Signed)
Pt called stating that she had eaten some unpasteurized ranch at work. Pt states that she didn't know that the ranch was unpasteurized. Pt denies feeling ill but wanted to find out if she should be concerned about any issues in the future. Per Dr. Macon Large, as long as pt is feeling fine there is nothing to be concerned about. Understanding was voiced.

## 2018-02-05 ENCOUNTER — Ambulatory Visit (INDEPENDENT_AMBULATORY_CARE_PROVIDER_SITE_OTHER): Payer: BLUE CROSS/BLUE SHIELD | Admitting: Family Medicine

## 2018-02-05 VITALS — BP 114/50 | HR 74 | Wt 149.0 lb

## 2018-02-05 DIAGNOSIS — Z3400 Encounter for supervision of normal first pregnancy, unspecified trimester: Secondary | ICD-10-CM

## 2018-02-05 DIAGNOSIS — O26899 Other specified pregnancy related conditions, unspecified trimester: Secondary | ICD-10-CM

## 2018-02-05 DIAGNOSIS — Z6791 Unspecified blood type, Rh negative: Secondary | ICD-10-CM

## 2018-02-05 DIAGNOSIS — D580 Hereditary spherocytosis: Secondary | ICD-10-CM

## 2018-02-05 NOTE — Progress Notes (Signed)
   PRENATAL VISIT NOTE  Subjective:  Brandi Lane is a 22 y.o. G1P0 at [redacted]w[redacted]d being seen today for ongoing prenatal care.  She is currently monitored for the following issues for this low-risk pregnancy and has Supervision of normal first pregnancy, antepartum; Spherocytosis (familial) (HCC); and Rh negative state in antepartum period on their problem list.  Patient reports no complaints.  Contractions: Not present. Vag. Bleeding: None.   . Denies leaking of fluid.   The following portions of the patient's history were reviewed and updated as appropriate: allergies, current medications, past family history, past medical history, past social history, past surgical history and problem list. Problem list updated.  Objective:   Vitals:   02/05/18 0825  BP: (!) 114/50  Pulse: 74  Weight: 149 lb (67.6 kg)    Fetal Status: Fetal Heart Rate (bpm): 150         General:  Alert, oriented and cooperative. Patient is in no acute distress.  Skin: Skin is warm and dry. No rash noted.   Cardiovascular: Normal heart rate noted  Respiratory: Normal respiratory effort, no problems with respiration noted  Abdomen: Soft, gravid, appropriate for gestational age.  Pain/Pressure: Absent     Pelvic: Cervical exam deferred        Extremities: Normal range of motion.  Edema: None  Mental Status: Normal mood and affect. Normal behavior. Normal judgment and thought content.   Assessment and Plan:  Pregnancy: G1P0 at [redacted]w[redacted]d  1. Supervision of normal first pregnancy, antepartum FHT and FH normal - [place] MFM OB COMP + 14 WK; Future  2. Rh negative state in antepartum period Rhogam at 28 weeks  3. Spherocytosis (familial) (HCC) On folic acid supplementation  Preterm labor symptoms and general obstetric precautions including but not limited to vaginal bleeding, contractions, leaking of fluid and fetal movement were reviewed in detail with the patient. Please refer to After Visit Summary for other  counseling recommendations.  Return in about 4 weeks (around 03/05/2018) for OB f/u.  No future appointments.  Levie Heritage, DO

## 2018-02-24 ENCOUNTER — Encounter (HOSPITAL_COMMUNITY): Payer: Self-pay

## 2018-03-03 ENCOUNTER — Ambulatory Visit (HOSPITAL_COMMUNITY)
Admission: RE | Admit: 2018-03-03 | Discharge: 2018-03-03 | Disposition: A | Discharge status: Home / Self Care | Payer: BLUE CROSS/BLUE SHIELD | Source: Ambulatory Visit | Attending: Family Medicine | Admitting: Family Medicine

## 2018-03-03 DIAGNOSIS — O360130 Maternal care for anti-D [Rh] antibodies, third trimester, not applicable or unspecified: Secondary | ICD-10-CM

## 2018-03-03 DIAGNOSIS — Z3400 Encounter for supervision of normal first pregnancy, unspecified trimester: Secondary | ICD-10-CM

## 2018-03-03 DIAGNOSIS — Z3A19 19 weeks gestation of pregnancy: Secondary | ICD-10-CM

## 2018-03-03 DIAGNOSIS — Z363 Encounter for antenatal screening for malformations: Secondary | ICD-10-CM | POA: Diagnosis not present

## 2018-03-03 DIAGNOSIS — Z3403 Encounter for supervision of normal first pregnancy, third trimester: Secondary | ICD-10-CM | POA: Insufficient documentation

## 2018-03-08 ENCOUNTER — Ambulatory Visit (INDEPENDENT_AMBULATORY_CARE_PROVIDER_SITE_OTHER): Payer: BLUE CROSS/BLUE SHIELD | Admitting: Obstetrics & Gynecology

## 2018-03-08 ENCOUNTER — Encounter: Payer: Self-pay | Admitting: Obstetrics & Gynecology

## 2018-03-08 VITALS — BP 110/65 | HR 77 | Wt 151.1 lb

## 2018-03-08 DIAGNOSIS — Z3400 Encounter for supervision of normal first pregnancy, unspecified trimester: Secondary | ICD-10-CM

## 2018-03-08 DIAGNOSIS — D580 Hereditary spherocytosis: Secondary | ICD-10-CM

## 2018-03-08 DIAGNOSIS — Z3A20 20 weeks gestation of pregnancy: Secondary | ICD-10-CM

## 2018-03-08 DIAGNOSIS — Z3402 Encounter for supervision of normal first pregnancy, second trimester: Secondary | ICD-10-CM

## 2018-03-08 DIAGNOSIS — O26892 Other specified pregnancy related conditions, second trimester: Secondary | ICD-10-CM

## 2018-03-08 DIAGNOSIS — Z6791 Unspecified blood type, Rh negative: Secondary | ICD-10-CM

## 2018-03-08 DIAGNOSIS — O26899 Other specified pregnancy related conditions, unspecified trimester: Secondary | ICD-10-CM

## 2018-03-08 NOTE — Progress Notes (Signed)
   PRENATAL VISIT NOTE  Subjective:  Brandi Lane is a 22 y.o. G1P0 at 6930w3d being seen today for ongoing prenatal care.  She is currently monitored for the following issues for this high-risk pregnancy and has Supervision of normal first pregnancy, antepartum; Spherocytosis (familial) (HCC); and Rh negative state in antepartum period on their problem list.  Patient reports no complaints.  Contractions: Not present. Vag. Bleeding: None.  Movement: Present. Denies leaking of fluid.   The following portions of the patient's history were reviewed and updated as appropriate: allergies, current medications, past family history, past medical history, past social history, past surgical history and problem list. Problem list updated.  Objective:   Vitals:   03/08/18 0909  BP: 110/65  Pulse: 77  Weight: 151 lb 1.9 oz (68.5 kg)    Fetal Status: Fetal Heart Rate (bpm): 149   Movement: Present     General:  Alert, oriented and cooperative. Patient is in no acute distress.  Skin: Skin is warm and dry. No rash noted.   Cardiovascular: Normal heart rate noted  Respiratory: Normal respiratory effort, no problems with respiration noted  Abdomen: Soft, gravid, appropriate for gestational age.  Pain/Pressure: Absent     Pelvic: Cervical exam deferred        Extremities: Normal range of motion.  Edema: None  Mental Status: Normal mood and affect. Normal behavior. Normal judgment and thought content.   Assessment and Plan:  Pregnancy: G1P0 at 5930w3d  1. Supervision of normal first pregnancy, antepartum Pt is considering Panorama and AFP. She wants to speak to her partner and may come in before her next visit or get them at her next visit. Counseled on the different options and the benefits vs risks of testing. Pt wants testing her partner is skeptical she will speak to him further and f/u.  2. Spherocytosis (familial) (HCC)  3. Rh negative state in antepartum period Needs Rhogam at 28 weeks     Preterm labor symptoms and general obstetric precautions including but not limited to vaginal bleeding, contractions, leaking of fluid and fetal movement were reviewed in detail with the patient. Please refer to After Visit Summary for other counseling recommendations.  Return in about 4 weeks (around 04/05/2018).  No future appointments.  Willodean Rosenthalarolyn Harraway-Smith, MD

## 2018-03-08 NOTE — Patient Instructions (Signed)
Second Trimester of Pregnancy The second trimester is from week 13 through week 28, month 4 through 6. This is often the time in pregnancy that you feel your best. Often times, morning sickness has lessened or quit. You may have more energy, and you may get hungry more often. Your unborn baby (fetus) is growing rapidly. At the end of the sixth month, he or she is about 9 inches long and weighs about 1 pounds. You will likely feel the baby move (quickening) between 18 and 20 weeks of pregnancy. Follow these instructions at home:  Avoid all smoking, herbs, and alcohol. Avoid drugs not approved by your doctor.  Do not use any tobacco products, including cigarettes, chewing tobacco, and electronic cigarettes. If you need help quitting, ask your doctor. You may get counseling or other support to help you quit.  Only take medicine as told by your doctor. Some medicines are safe and some are not during pregnancy.  Exercise only as told by your doctor. Stop exercising if you start having cramps.  Eat regular, healthy meals.  Wear a good support bra if your breasts are tender.  Do not use hot tubs, steam rooms, or saunas.  Wear your seat belt when driving.  Avoid raw meat, uncooked cheese, and liter boxes and soil used by cats.  Take your prenatal vitamins.  Take 1500-2000 milligrams of calcium daily starting at the 20th week of pregnancy until you deliver your baby.  Try taking medicine that helps you poop (stool softener) as needed, and if your doctor approves. Eat more fiber by eating fresh fruit, vegetables, and whole grains. Drink enough fluids to keep your pee (urine) clear or pale yellow.  Take warm water baths (sitz baths) to soothe pain or discomfort caused by hemorrhoids. Use hemorrhoid cream if your doctor approves.  If you have puffy, bulging veins (varicose veins), wear support hose. Raise (elevate) your feet for 15 minutes, 3-4 times a day. Limit salt in your diet.  Avoid heavy  lifting, wear low heals, and sit up straight.  Rest with your legs raised if you have leg cramps or low back pain.  Visit your dentist if you have not gone during your pregnancy. Use a soft toothbrush to brush your teeth. Be gentle when you floss.  You can have sex (intercourse) unless your doctor tells you not to.  Go to your doctor visits. Get help if:  You feel dizzy.  You have mild cramps or pressure in your lower belly (abdomen).  You have a nagging pain in your belly area.  You continue to feel sick to your stomach (nauseous), throw up (vomit), or have watery poop (diarrhea).  You have bad smelling fluid coming from your vagina.  You have pain with peeing (urination). Get help right away if:  You have a fever.  You are leaking fluid from your vagina.  You have spotting or bleeding from your vagina.  You have severe belly cramping or pain.  You lose or gain weight rapidly.  You have trouble catching your breath and have chest pain.  You notice sudden or extreme puffiness (swelling) of your face, hands, ankles, feet, or legs.  You have not felt the baby move in over an hour.  You have severe headaches that do not go away with medicine.  You have vision changes. This information is not intended to replace advice given to you by your health care provider. Make sure you discuss any questions you have with your health care   provider. Document Released: 06/25/2009 Document Revised: 09/06/2015 Document Reviewed: 06/01/2012 Elsevier Interactive Patient Education  2017 [employer]  

## 2018-03-15 ENCOUNTER — Telehealth: Payer: Self-pay

## 2018-03-15 ENCOUNTER — Other Ambulatory Visit: Payer: Self-pay

## 2018-03-15 ENCOUNTER — Inpatient Hospital Stay (HOSPITAL_COMMUNITY)
Admission: AD | Admit: 2018-03-15 | Discharge: 2018-03-15 | Disposition: A | Discharge status: Home / Self Care | Payer: BLUE CROSS/BLUE SHIELD | Source: Ambulatory Visit | Attending: Obstetrics and Gynecology | Admitting: Obstetrics and Gynecology

## 2018-03-15 DIAGNOSIS — Z3A21 21 weeks gestation of pregnancy: Secondary | ICD-10-CM | POA: Insufficient documentation

## 2018-03-15 DIAGNOSIS — O368120 Decreased fetal movements, second trimester, not applicable or unspecified: Secondary | ICD-10-CM | POA: Insufficient documentation

## 2018-03-15 DIAGNOSIS — Z87891 Personal history of nicotine dependence: Secondary | ICD-10-CM | POA: Diagnosis not present

## 2018-03-15 DIAGNOSIS — Z3492 Encounter for supervision of normal pregnancy, unspecified, second trimester: Secondary | ICD-10-CM

## 2018-03-15 NOTE — MAU Note (Signed)
Lots of movement (audible and felt) by nurse [facility]

## 2018-03-15 NOTE — Telephone Encounter (Signed)
Patient called stating that she is 21 weeks. Patient states that she felt her baby move "a lot last week" and today has only felt movement once or twice. Patient denies any bleeding. Patient encourage to sit and really concentrate on fetal movements for about 30 mins. Patient encourage to drink some cold water while she is sitting and counting movements. If she is unable to feel movement [facility] Patient states understanding and agreeable with plan.

## 2018-03-15 NOTE — Discharge Instructions (Signed)

## 2018-03-15 NOTE — MAU Note (Signed)
Just hasn't felt the baby move the last 2 nights.  Had been feeling her at night for the past wk.  Thinks she felt her at work today, but not sure. No bleeding, leaking, or apin.

## 2018-03-15 NOTE — MAU Provider Note (Signed)
History   [id number]   Chief Complaint  Patient presents with  . Decreased Fetal Movement    HPI Brandi Lane is a 22 y.o. female  G1P0 here with report of decreased fetal movement since 2 days ago.  Reports feeling the baby move approximately 2-3 times in the past 24 hour.  Denies vaginal bleeding or leaking of fluid.  Feels occasional contraction. States she normally feels baby move at night but hasn't felt baby move the last 2 nights. Called the office & was instructed to come to MAU for evaluation.   Patient's last menstrual period was 10/16/2017 (exact date).  OB History  Gravida Para Term Preterm AB Living  1            SAB TAB Ectopic Multiple Live Births               # Outcome Date GA Lbr Len/2nd Weight Sex Delivery Anes PTL Lv  1 Current             Past Medical History:  Diagnosis Date  . History of blood transfusion 2013  . Spherocytosis (HCC)     No family history on file.  Social History   Socioeconomic History  . Marital status: Single    Spouse name: Not on file  . Number of children: Not on file  . Years of education: Not on file  . Highest education level: Not on file  Occupational History  . Not on file  Social Needs  . Financial resource strain: Not on file  . Food insecurity:    Worry: Not on file    Inability: Not on file  . Transportation needs:    Medical: Not on file    Non-medical: Not on file  Tobacco Use  . Smoking status: Former Smoker    Last attempt to quit: 12/11/2013    Years since quitting: 4.2  . Smokeless tobacco: Never Used  Substance and Sexual Activity  . Alcohol use: Not Currently    Comment: occasional   . Drug use: No  . Sexual activity: Yes    Birth control/protection: None  Lifestyle  . Physical activity:    Days per week: Not on file    Minutes per session: Not on file  . Stress: Not on file  Relationships  . Social connections:    Talks on phone: Not on file    Gets together: Not on file   Attends religious service: Not on file    Active member of club or organization: Not on file    Attends meetings of clubs or organizations: Not on file    Relationship status: Not on file  Other Topics Concern  . Not on file  Social History Narrative  . Not on file    Allergies  Allergen Reactions  . Sulfa Antibiotics     Mother and grandmother are allergic     No current facility-administered medications on file prior to encounter.    Current Outpatient Medications on File Prior to Encounter  Medication Sig Dispense Refill  . folic acid (FOLVITE) 1 MG tablet Take 1 mg by mouth daily.      . Prenatal Vit-Fe Fumarate-FA (MULTIVITAMIN-PRENATAL) 27-0.8 MG TABS tablet Take 1 tablet by mouth daily at 12 noon.       Review of Systems  Gastrointestinal: Negative.   Genitourinary: Negative.     Physical Exam   Vitals:   03/15/18 1847  BP: 117/71  Pulse: (!) 108  Resp:  18  Temp: 98.6 F (37 C)  TempSrc: Oral  SpO2: 100%    Physical Exam  Constitutional: She is oriented to person, place, and time. She appears well-developed and well-nourished. No distress.  Respiratory: Effort normal and breath sounds normal.  Neurological: She is alert and oriented to person, place, and time.  Skin: She is not diaphoretic.  Psychiatric: She has a normal mood and affect. Her behavior is normal.    MAU Course  Procedures  MDM FHT present via doppler & patient reassured. Too early in gestation for NST.  Discussed expectations of fetal movement at this gestation.  No pain, bleeding, or LOF.   Assessment and Plan  A: 1. Decreased fetal movements in second trimester, single or unspecified fetus   2. Fetal heart tones present, second trimester   3. [redacted] weeks gestation of pregnancy    P: Discharge home in stable condition Discussed reasons to return to MAU Keep f/u with OB   Judeth Horn, NP 03/15/2018 6:50 PM

## 2018-04-02 ENCOUNTER — Ambulatory Visit (INDEPENDENT_AMBULATORY_CARE_PROVIDER_SITE_OTHER): Payer: BLUE CROSS/BLUE SHIELD | Admitting: Obstetrics & Gynecology

## 2018-04-02 VITALS — BP 107/65 | HR 75 | Wt 158.0 lb

## 2018-04-02 DIAGNOSIS — O26892 Other specified pregnancy related conditions, second trimester: Secondary | ICD-10-CM

## 2018-04-02 DIAGNOSIS — O26899 Other specified pregnancy related conditions, unspecified trimester: Secondary | ICD-10-CM

## 2018-04-02 DIAGNOSIS — Z3402 Encounter for supervision of normal first pregnancy, second trimester: Secondary | ICD-10-CM

## 2018-04-02 DIAGNOSIS — Z3400 Encounter for supervision of normal first pregnancy, unspecified trimester: Secondary | ICD-10-CM

## 2018-04-02 DIAGNOSIS — D580 Hereditary spherocytosis: Secondary | ICD-10-CM

## 2018-04-02 DIAGNOSIS — Z6791 Unspecified blood type, Rh negative: Secondary | ICD-10-CM

## 2018-04-02 NOTE — Progress Notes (Signed)
   PRENATAL VISIT NOTE  Subjective:  Brandi CanalesJacqueline B Lane is a 22 y.o. G1P0 at 1333w0d being seen today for ongoing prenatal care.  She is currently monitored for the following issues for this low-risk pregnancy and has Supervision of normal first pregnancy, antepartum; Spherocytosis (familial) (HCC); and Rh negative state in antepartum period on their problem list.  Patient reports no complaints.  Contractions: Not present. Vag. Bleeding: None.  Movement: Present. Denies leaking of fluid.   The following portions of the patient's history were reviewed and updated as appropriate: allergies, current medications, past family history, past medical history, past social history, past surgical history and problem list. Problem list updated.  Objective:   Vitals:   04/02/18 0935  BP: 107/65  Pulse: 75  Weight: 158 lb (71.7 kg)    Fetal Status: Fetal Heart Rate (bpm): 150   Movement: Present     General:  Alert, oriented and cooperative. Patient is in no acute distress.  Skin: Skin is warm and dry. No rash noted.   Cardiovascular: Normal heart rate noted  Respiratory: Normal respiratory effort, no problems with respiration noted  Abdomen: Soft, gravid, appropriate for gestational age.  Pain/Pressure: Absent     Pelvic: Cervical exam deferred        Extremities: Normal range of motion.  Edema: None  Mental Status: Normal mood and affect. Normal behavior. Normal judgment and thought content.   Assessment and Plan:  Pregnancy: G1P0 at 6033w0d  1. Supervision of normal first pregnancy, antepartum GTT next visit   2. Spherocytosis (familial) (HCC)  3. Rh negative state in antepartum period Rhogam next visit  Preterm labor symptoms and general obstetric precautions including but not limited to vaginal bleeding, contractions, leaking of fluid and fetal movement were reviewed in detail with the patient. Please refer to After Visit Summary for other counseling recommendations.  Return in about 4  weeks (around 04/30/2018).  No future appointments.  Willodean Rosenthalarolyn Harraway-Smith, MD

## 2018-04-14 NOTE — L&D Delivery Note (Signed)
Patient: Brandi Lane MRN: [id number]  GBS status: negative, IAP given: none  Patient is a 23 y.o. now G1P1 s/p NSVD at [redacted]w[redacted]d, who was admitted for PROM. SROM 20h 90m prior to delivery with clear fluid.    Delivery Note At 1:18 PM a viable female was delivered via Vaginal, Spontaneous (Presentation: cephalic; LOA).  APGAR: 9, 9; weight: 3400g  .   Placenta status: intact, 3-vessel cord.  Cord:  with the following complications: none.  Cord pH: not collected  Ms. Terrilee Croak pushed for ~2 hours with nursing staff.  I pushed with the patient for about the next 30 minutes, at which point baby started to have recurrent late decelerations.  Dr. Ashok Pall came to bedside at that point to consider vacuum assisted delivery.   Indication for operative vaginal delivery:   Risks of vacuum assistance were discussed in detail, including but not limited to, bleeding, infection, damage to maternal tissues, fetal cephalohematoma, inability to effect vaginal delivery of the head and need for emergency cesarean section.  Patient gave verbal consent.  Patient was examined and found to be fully dilated with fetal station of +2. The kiwi vacuum cup was positioned over the sagittal suture 3 cm anterior to posterior fontanelle.  Pressure was then increased to 500 mmHg, and the patient was instructed to push.  Pulling was administered along the pelvic curve.  Approximately 15 minutes of pull was administered during push, 2 popoffs.  Dr. Alysia Penna was called into the room at that point. He performed a midline episiotomy after administration of local lidocaine. The infant was then delivered LOA atraumatically, noted to be a viable female infant, Apgars of 9 and 9, weight was 7 pounds 7.9 ounces.  Cord clamped x 2 after 1-minute delay, and cut by family member. Cord blood drawn. There was spontaneous placental delivery, intact with three-vessel cord.  Fundus firm with massage and Pitocin. IM methergine was administered due to long  second stage of labor.  Perineum inspected and found to have 2nd degree laceration, which was repaired with good hemostasis achieved.  EBL 522, epidural anesthesia.  Sponge, instrument and needle counts were correct x2.  The patient and baby were stable after delivery and remained in couplet care.  Neonatology present for delivery.  Anesthesia:  Epidural Episiotomy: Median Lacerations: None Suture Repair: 3.0 vicryl Est. Blood Loss (mL): 522  Mom to postpartum.  Baby to Couplet care / Skin to Skin.  Gwenevere Abbot 07/07/2018, 2:28 PM

## 2018-04-30 ENCOUNTER — Ambulatory Visit (INDEPENDENT_AMBULATORY_CARE_PROVIDER_SITE_OTHER): Payer: BLUE CROSS/BLUE SHIELD | Admitting: Family Medicine

## 2018-04-30 VITALS — BP 110/63 | HR 81 | Wt 160.1 lb

## 2018-04-30 DIAGNOSIS — Z3403 Encounter for supervision of normal first pregnancy, third trimester: Secondary | ICD-10-CM

## 2018-04-30 DIAGNOSIS — O26893 Other specified pregnancy related conditions, third trimester: Secondary | ICD-10-CM

## 2018-04-30 DIAGNOSIS — Z23 Encounter for immunization: Secondary | ICD-10-CM | POA: Diagnosis not present

## 2018-04-30 DIAGNOSIS — O9989 Other specified diseases and conditions complicating pregnancy, childbirth and the puerperium: Secondary | ICD-10-CM

## 2018-04-30 DIAGNOSIS — M9903 Segmental and somatic dysfunction of lumbar region: Secondary | ICD-10-CM

## 2018-04-30 DIAGNOSIS — O360930 Maternal care for other rhesus isoimmunization, third trimester, not applicable or unspecified: Secondary | ICD-10-CM | POA: Diagnosis not present

## 2018-04-30 DIAGNOSIS — M549 Dorsalgia, unspecified: Secondary | ICD-10-CM | POA: Diagnosis not present

## 2018-04-30 DIAGNOSIS — O26899 Other specified pregnancy related conditions, unspecified trimester: Secondary | ICD-10-CM

## 2018-04-30 DIAGNOSIS — Z6791 Unspecified blood type, Rh negative: Secondary | ICD-10-CM

## 2018-04-30 DIAGNOSIS — Z3400 Encounter for supervision of normal first pregnancy, unspecified trimester: Secondary | ICD-10-CM

## 2018-04-30 MED ORDER — RHO D IMMUNE GLOBULIN 1500 UNIT/2ML IJ SOSY
300.0000 ug | PREFILLED_SYRINGE | Freq: Once | INTRAMUSCULAR | Status: AC
  Administered 2018-04-30: 300 ug via INTRAMUSCULAR

## 2018-04-30 NOTE — Progress Notes (Signed)
   PRENATAL VISIT NOTE  Subjective:  JAMERIA JOU is a 23 y.o. G1P0 at [redacted]w[redacted]d being seen today for ongoing prenatal care.  She is currently monitored for the following issues for this low-risk pregnancy and has Supervision of normal first pregnancy, antepartum; Spherocytosis (familial) (HCC); and Rh negative state in antepartum period on their problem list.  Patient reports backache, worse with laying on sides.  Contractions: Not present. Vag. Bleeding: None.  Movement: Present. Denies leaking of fluid.   The following portions of the patient's history were reviewed and updated as appropriate: allergies, current medications, past family history, past medical history, past social history, past surgical history and problem list. Problem list updated.  Objective:   Vitals:   04/30/18 0859  BP: 110/63  Pulse: 81  Weight: 160 lb 1.9 oz (72.6 kg)    Fetal Status: Fetal Heart Rate (bpm): 147   Movement: Present     General:  Alert, oriented and cooperative. Patient is in no acute distress.  Skin: Skin is warm and dry. No rash noted.   Cardiovascular: Normal heart rate noted  Respiratory: Normal respiratory effort, no problems with respiration noted  Abdomen: Soft, gravid, appropriate for gestational age. Pain/Pressure: Absent     Pelvic:  Cervical exam deferred        MSK: Restriction, tenderness, tissue texture changes, and paraspinal spasm in the lumbar spine  Neuro: Moves all four extremities with no focal neurological deficit  Extremities: Normal range of motion.  Edema: None  Mental Status: Normal mood and affect. Normal behavior. Normal judgment and thought content.   OSE: Head   Cervical   Thoracic   Rib   Lumbar L5 ESRL, L1 ESRR  Sacrum L/L  Pelvis Right ant innom    Assessment and Plan:  Pregnancy: G1P0 at [redacted]w[redacted]d  1. Supervision of normal first pregnancy, antepartum FHT and FH normal - CBC - HIV antibody (with reflex) - RPR - Glucose Tolerance, 2 Hours w/1  Hour - Tdap vaccine greater than or equal to 7yo IM  2. Rh negative state in antepartum period Rhogam today  3. Back pain in pregnancy 4. Somatic dysfunction of lumbar region OMT done after patient permission. HVLA technique utilized. 3 areas treated with improvement of tissue texture and joint mobility. Patient tolerated procedure well.    Preterm labor symptoms and general obstetric precautions including but not limited to vaginal bleeding, contractions, leaking of fluid and fetal movement were reviewed in detail with the patient. Please refer to After Visit Summary for other counseling recommendations.  Return in about 2 weeks (around 05/14/2018) for OB f/u.  Levie Heritage, DO

## 2018-05-01 LAB — CBC
HEMATOCRIT: 30 % — AB (ref 34.0–46.6)
Hemoglobin: 10.6 g/dL — ABNORMAL LOW (ref 11.1–15.9)
MCH: 34.1 pg — ABNORMAL HIGH (ref 26.6–33.0)
MCHC: 35.3 g/dL (ref 31.5–35.7)
MCV: 97 fL (ref 79–97)
PLATELETS: 221 10*3/uL (ref 150–450)
RBC: 3.11 x10E6/uL — AB (ref 3.77–5.28)
RDW: 18.9 % — AB (ref 11.7–15.4)
WBC: 7.4 10*3/uL (ref 3.4–10.8)

## 2018-05-01 LAB — HIV ANTIBODY (ROUTINE TESTING W REFLEX): HIV Screen 4th Generation wRfx: NONREACTIVE

## 2018-05-01 LAB — GLUCOSE TOLERANCE, 2 HOURS W/ 1HR
GLUCOSE, FASTING: 88 mg/dL (ref 65–91)
Glucose, 1 hour: 103 mg/dL (ref 65–179)
Glucose, 2 hour: 113 mg/dL (ref 65–152)

## 2018-05-01 LAB — RPR: RPR: NONREACTIVE

## 2018-05-13 ENCOUNTER — Ambulatory Visit (INDEPENDENT_AMBULATORY_CARE_PROVIDER_SITE_OTHER): Payer: BLUE CROSS/BLUE SHIELD | Admitting: Family Medicine

## 2018-05-13 VITALS — BP 118/72 | HR 80 | Wt 164.0 lb

## 2018-05-13 DIAGNOSIS — Z6791 Unspecified blood type, Rh negative: Secondary | ICD-10-CM

## 2018-05-13 DIAGNOSIS — O26893 Other specified pregnancy related conditions, third trimester: Secondary | ICD-10-CM

## 2018-05-13 DIAGNOSIS — O9989 Other specified diseases and conditions complicating pregnancy, childbirth and the puerperium: Secondary | ICD-10-CM

## 2018-05-13 DIAGNOSIS — O26899 Other specified pregnancy related conditions, unspecified trimester: Secondary | ICD-10-CM

## 2018-05-13 DIAGNOSIS — Z3403 Encounter for supervision of normal first pregnancy, third trimester: Secondary | ICD-10-CM

## 2018-05-13 DIAGNOSIS — M549 Dorsalgia, unspecified: Secondary | ICD-10-CM

## 2018-05-13 DIAGNOSIS — Z3400 Encounter for supervision of normal first pregnancy, unspecified trimester: Secondary | ICD-10-CM

## 2018-05-13 DIAGNOSIS — M9903 Segmental and somatic dysfunction of lumbar region: Secondary | ICD-10-CM

## 2018-05-13 NOTE — Progress Notes (Signed)
   PRENATAL VISIT NOTE  Subjective:  Brandi Lane is a 23 y.o. G1P0 at [redacted]w[redacted]d being seen today for ongoing prenatal care.  She is currently monitored for the following issues for this low-risk pregnancy and has Supervision of normal first pregnancy, antepartum; Spherocytosis (familial) (HCC); and Rh negative state in antepartum period on their problem list.  Patient reports backache - improved some after OMT last visit.  Contractions: Irritability. Vag. Bleeding: None.  Movement: Present. Denies leaking of fluid.   The following portions of the patient's history were reviewed and updated as appropriate: allergies, current medications, past family history, past medical history, past social history, past surgical history and problem list. Problem list updated.  Objective:   Vitals:   05/13/18 0849  BP: 118/72  Pulse: 80  Weight: 164 lb 0.6 oz (74.4 kg)    Fetal Status: Fetal Heart Rate (bpm): 144 Fundal Height: 30 cm Movement: Present     General:  Alert, oriented and cooperative. Patient is in no acute distress.  Skin: Skin is warm and dry. No rash noted.   Cardiovascular: Normal heart rate noted  Respiratory: Normal respiratory effort, no problems with respiration noted  Abdomen: Soft, gravid, appropriate for gestational age. Pain/Pressure: Present     Pelvic:  Cervical exam deferred        MSK: Restriction, tenderness, tissue texture changes, and paraspinal spasm in the lumbar spine  Neuro: Moves all four extremities with no focal neurological deficit  Extremities: Normal range of motion.  Edema: None  Mental Status: Normal mood and affect. Normal behavior. Normal judgment and thought content.   OSE: Head   Cervical   Thoracic   Rib   Lumbar L5 ESRL, L1 ESRR  Sacrum L/L  Pelvis Right ant innom    Assessment and Plan:  Pregnancy: G1P0 at [redacted]w[redacted]d  1. Supervision of normal first pregnancy, antepartum FHT and FH normal  2. Rh negative state in antepartum  period Rhogam given at 28 weeks  3. Back pain in pregnancy 4. Somatic dysfunction of lumbar region OMT done after patient permission. HVLA technique utilized. 3 areas treated with improvement of tissue texture and joint mobility. Patient tolerated procedure well.    Preterm labor symptoms and general obstetric precautions including but not limited to vaginal bleeding, contractions, leaking of fluid and fetal movement were reviewed in detail with the patient. Please refer to After Visit Summary for other counseling recommendations.  No follow-ups on file.  Levie Heritage, DO

## 2018-05-14 ENCOUNTER — Encounter (HOSPITAL_COMMUNITY): Payer: Self-pay

## 2018-05-27 ENCOUNTER — Ambulatory Visit (INDEPENDENT_AMBULATORY_CARE_PROVIDER_SITE_OTHER): Payer: BLUE CROSS/BLUE SHIELD | Admitting: Family Medicine

## 2018-05-27 VITALS — BP 121/72 | HR 86 | Wt 169.0 lb

## 2018-05-27 DIAGNOSIS — Z3400 Encounter for supervision of normal first pregnancy, unspecified trimester: Secondary | ICD-10-CM

## 2018-05-27 DIAGNOSIS — O26899 Other specified pregnancy related conditions, unspecified trimester: Secondary | ICD-10-CM

## 2018-05-27 DIAGNOSIS — O9989 Other specified diseases and conditions complicating pregnancy, childbirth and the puerperium: Secondary | ICD-10-CM

## 2018-05-27 DIAGNOSIS — O26893 Other specified pregnancy related conditions, third trimester: Secondary | ICD-10-CM

## 2018-05-27 DIAGNOSIS — M549 Dorsalgia, unspecified: Secondary | ICD-10-CM | POA: Diagnosis not present

## 2018-05-27 DIAGNOSIS — Z3403 Encounter for supervision of normal first pregnancy, third trimester: Secondary | ICD-10-CM

## 2018-05-27 DIAGNOSIS — M9903 Segmental and somatic dysfunction of lumbar region: Secondary | ICD-10-CM

## 2018-05-27 DIAGNOSIS — Z6791 Unspecified blood type, Rh negative: Secondary | ICD-10-CM

## 2018-05-27 NOTE — Progress Notes (Signed)
   PRENATAL VISIT NOTE  Subjective:  Brandi Lane is a 23 y.o. G1P0 at [redacted]w[redacted]d being seen today for ongoing prenatal care.  She is currently monitored for the following issues for this low-risk pregnancy and has Supervision of normal first pregnancy, antepartum; Spherocytosis (familial) (HCC); and Rh negative state in antepartum period on their problem list.  Patient reports backache - improved with OMT. Had some severe backpain last night.  Contractions: Irritability. Vag. Bleeding: None.  Movement: Present. Denies leaking of fluid.   The following portions of the patient's history were reviewed and updated as appropriate: allergies, current medications, past family history, past medical history, past social history, past surgical history and problem list. Problem list updated.  Objective:   Vitals:   05/27/18 1400  BP: 121/72  Pulse: 86  Weight: 169 lb (76.7 kg)    Fetal Status: Fetal Heart Rate (bpm): 140   Movement: Present     General:  Alert, oriented and cooperative. Patient is in no acute distress.  Skin: Skin is warm and dry. No rash noted.   Cardiovascular: Normal heart rate noted  Respiratory: Normal respiratory effort, no problems with respiration noted  Abdomen: Soft, gravid, appropriate for gestational age. Pain/Pressure: Present     Pelvic:  Cervical exam deferred        MSK: Restriction, tenderness, tissue texture changes, and paraspinal spasm in the lumbar spine  Neuro: Moves all four extremities with no focal neurological deficit  Extremities: Normal range of motion.  Edema: None  Mental Status: Normal mood and affect. Normal behavior. Normal judgment and thought content.   OSE: Head   Cervical   Thoracic   Rib   Lumbar L5 ESRL, L1 ESRR  Sacrum L/L  Pelvis Right ant innom    Assessment and Plan:  Pregnancy: G1P0 at [redacted]w[redacted]d  1. Supervision of normal first pregnancy, antepartum FHT and FH normal  2. Rh negative state in antepartum period  3. Back  pain in pregnancy 4. Somatic dysfunction of lumbar region OMT done after patient permission. HVLA technique utilized. 3 areas treated with improvement of tissue texture and joint mobility. Patient tolerated procedure well.    Preterm labor symptoms and general obstetric precautions including but not limited to vaginal bleeding, contractions, leaking of fluid and fetal movement were reviewed in detail with the patient. Please refer to After Visit Summary for other counseling recommendations.  Return in about 2 weeks (around 06/10/2018).  Levie Heritage, DO

## 2018-06-11 ENCOUNTER — Ambulatory Visit (INDEPENDENT_AMBULATORY_CARE_PROVIDER_SITE_OTHER): Payer: BLUE CROSS/BLUE SHIELD | Admitting: Family Medicine

## 2018-06-11 VITALS — BP 122/62 | HR 75 | Wt 171.0 lb

## 2018-06-11 DIAGNOSIS — Z3A34 34 weeks gestation of pregnancy: Secondary | ICD-10-CM

## 2018-06-11 DIAGNOSIS — D580 Hereditary spherocytosis: Secondary | ICD-10-CM

## 2018-06-11 DIAGNOSIS — Z3400 Encounter for supervision of normal first pregnancy, unspecified trimester: Secondary | ICD-10-CM

## 2018-06-11 DIAGNOSIS — Z3403 Encounter for supervision of normal first pregnancy, third trimester: Secondary | ICD-10-CM

## 2018-06-11 NOTE — Progress Notes (Signed)
   PRENATAL VISIT NOTE  Subjective:  Brandi Lane is a 23 y.o. G1P0 at [redacted]w[redacted]d being seen today for ongoing prenatal care.  She is currently monitored for the following issues for this low-risk pregnancy and has Supervision of normal first pregnancy, antepartum; Spherocytosis (familial) (HCC); and Rh negative state in antepartum period on their problem list.  Patient reports difficulty sleeping.  Contractions: Irritability. Vag. Bleeding: None.  Movement: Present. Denies leaking of fluid.   The following portions of the patient's history were reviewed and updated as appropriate: allergies, current medications, past family history, past medical history, past social history, past surgical history and problem list. Problem list updated.  Objective:   Vitals:   06/11/18 0916  BP: 122/62  Pulse: 75  Weight: 171 lb (77.6 kg)    Fetal Status: Fetal Heart Rate (bpm): 130   Movement: Present     General:  Alert, oriented and cooperative. Patient is in no acute distress.  Skin: Skin is warm and dry. No rash noted.   Cardiovascular: Normal heart rate noted  Respiratory: Normal respiratory effort, no problems with respiration noted  Abdomen: Soft, gravid, appropriate for gestational age.  Pain/Pressure: Present     Pelvic: Cervical exam deferred        Extremities: Normal range of motion.  Edema: None  Mental Status: Normal mood and affect. Normal behavior. Normal judgment and thought content.   Assessment and Plan:  Pregnancy: G1P0 at [redacted]w[redacted]d  1. Supervision of normal first pregnancy, antepartum fht and fh normal  2. Spherocytosis (familial) (HCC)   Preterm labor symptoms and general obstetric precautions including but not limited to vaginal bleeding, contractions, leaking of fluid and fetal movement were reviewed in detail with the patient. Please refer to After Visit Summary for other counseling recommendations.  No follow-ups on file.  Future Appointments  Date Time Provider  Department Center  06/24/2018  9:00 AM Levie Heritage, DO [facility] None    Levie Heritage, DO

## 2018-06-24 ENCOUNTER — Other Ambulatory Visit: Payer: Self-pay

## 2018-06-24 ENCOUNTER — Ambulatory Visit (INDEPENDENT_AMBULATORY_CARE_PROVIDER_SITE_OTHER): Payer: BLUE CROSS/BLUE SHIELD | Admitting: Family Medicine

## 2018-06-24 VITALS — BP 113/71 | HR 73 | Wt 174.0 lb

## 2018-06-24 DIAGNOSIS — O26899 Other specified pregnancy related conditions, unspecified trimester: Secondary | ICD-10-CM

## 2018-06-24 DIAGNOSIS — Z3400 Encounter for supervision of normal first pregnancy, unspecified trimester: Secondary | ICD-10-CM

## 2018-06-24 DIAGNOSIS — O360923 Maternal care for other rhesus isoimmunization, second trimester, fetus 3: Secondary | ICD-10-CM

## 2018-06-24 DIAGNOSIS — Z6791 Unspecified blood type, Rh negative: Secondary | ICD-10-CM

## 2018-06-24 DIAGNOSIS — Z3A35 35 weeks gestation of pregnancy: Secondary | ICD-10-CM

## 2018-06-24 DIAGNOSIS — O26893 Other specified pregnancy related conditions, third trimester: Secondary | ICD-10-CM

## 2018-06-24 NOTE — Progress Notes (Signed)
   PRENATAL VISIT NOTE  Subjective:  Brandi Lane is a 23 y.o. G1P0 at [redacted]w[redacted]d being seen today for ongoing prenatal care.  She is currently monitored for the following issues for this low-risk pregnancy and has Supervision of normal first pregnancy, antepartum; Spherocytosis (familial) (HCC); and Rh negative state in antepartum period on their problem list.  Patient reports occasional contractions.  Contractions: Irritability. Vag. Bleeding: None.  Movement: Present. Denies leaking of fluid.   The following portions of the patient's history were reviewed and updated as appropriate: allergies, current medications, past family history, past medical history, past social history, past surgical history and problem list.   Objective:   Vitals:   06/24/18 1005  BP: 113/71  Pulse: 73  Weight: 174 lb (78.9 kg)    Fetal Status: Fetal Heart Rate (bpm): 151 Fundal Height: 36 cm Movement: Present     General:  Alert, oriented and cooperative. Patient is in no acute distress.  Skin: Skin is warm and dry. No rash noted.   Cardiovascular: Normal heart rate noted  Respiratory: Normal respiratory effort, no problems with respiration noted  Abdomen: Soft, gravid, appropriate for gestational age.  Pain/Pressure: Present     Pelvic: Cervical exam deferred        Extremities: Normal range of motion.  Edema: Trace  Mental Status: Normal mood and affect. Normal behavior. Normal judgment and thought content.   Assessment and Plan:  Pregnancy: G1P0 at [redacted]w[redacted]d 1. Supervision of normal first pregnancy, antepartum FHT and FH normal  2. Rh negative state in antepartum period   Preterm labor symptoms and general obstetric precautions including but not limited to vaginal bleeding, contractions, leaking of fluid and fetal movement were reviewed in detail with the patient. Please refer to After Visit Summary for other counseling recommendations.   No follow-ups on file.  No future appointments.  Levie Heritage, DO

## 2018-07-01 ENCOUNTER — Ambulatory Visit (INDEPENDENT_AMBULATORY_CARE_PROVIDER_SITE_OTHER): Payer: BLUE CROSS/BLUE SHIELD | Admitting: Family Medicine

## 2018-07-01 ENCOUNTER — Other Ambulatory Visit: Payer: Self-pay

## 2018-07-01 VITALS — BP 127/88 | HR 70 | Wt 175.0 lb

## 2018-07-01 DIAGNOSIS — O26899 Other specified pregnancy related conditions, unspecified trimester: Secondary | ICD-10-CM

## 2018-07-01 DIAGNOSIS — Z113 Encounter for screening for infections with a predominantly sexual mode of transmission: Secondary | ICD-10-CM

## 2018-07-01 DIAGNOSIS — Z3400 Encounter for supervision of normal first pregnancy, unspecified trimester: Secondary | ICD-10-CM

## 2018-07-01 DIAGNOSIS — Z3A36 36 weeks gestation of pregnancy: Secondary | ICD-10-CM

## 2018-07-01 DIAGNOSIS — O360923 Maternal care for other rhesus isoimmunization, second trimester, fetus 3: Secondary | ICD-10-CM

## 2018-07-01 DIAGNOSIS — Z6791 Unspecified blood type, Rh negative: Secondary | ICD-10-CM

## 2018-07-01 NOTE — Progress Notes (Signed)
   PRENATAL VISIT NOTE  Subjective:  Brandi Lane is a 23 y.o. G1P0 at [redacted]w[redacted]d being seen today for ongoing prenatal care.  She is currently monitored for the following issues for this low-risk pregnancy and has Supervision of normal first pregnancy, antepartum; Spherocytosis (familial) (HCC); and Rh negative state in antepartum period on their problem list.  Patient reports no complaints.  Contractions: Irritability. Vag. Bleeding: None.  Movement: Present. Denies leaking of fluid.   The following portions of the patient's history were reviewed and updated as appropriate: allergies, current medications, past family history, past medical history, past social history, past surgical history and problem list.   Objective:   Vitals:   07/01/18 0951  BP: 127/88  Pulse: 70  Weight: 175 lb (79.4 kg)    Fetal Status:   Fundal Height: 37 cm Movement: Present     General:  Alert, oriented and cooperative. Patient is in no acute distress.  Skin: Skin is warm and dry. No rash noted.   Cardiovascular: Normal heart rate noted  Respiratory: Normal respiratory effort, no problems with respiration noted  Abdomen: Soft, gravid, appropriate for gestational age.  Pain/Pressure: Present     Pelvic: Cervical exam deferred        Extremities: Normal range of motion.  Edema: Trace  Mental Status: Normal mood and affect. Normal behavior. Normal judgment and thought content.   Assessment and Plan:  Pregnancy: G1P0 at [redacted]w[redacted]d 1. Supervision of normal first pregnancy, antepartum FHT and FH normal - Culture, beta strep (group b only) - GC/Chlamydia probe amp ([place])not at [facility]  2. Rh negative state in antepartum period   Preterm labor symptoms and general obstetric precautions including but not limited to vaginal bleeding, contractions, leaking of fluid and fetal movement were reviewed in detail with the patient. Please refer to After Visit Summary for other counseling recommendations.   No  follow-ups on file.  No future appointments.  Levie Heritage, DO

## 2018-07-03 LAB — GC/CHLAMYDIA PROBE AMP (~~LOC~~) NOT AT ARMC
CHLAMYDIA, DNA PROBE: NEGATIVE
Neisseria Gonorrhea: NEGATIVE

## 2018-07-05 ENCOUNTER — Encounter (HOSPITAL_COMMUNITY): Payer: Self-pay

## 2018-07-05 ENCOUNTER — Other Ambulatory Visit: Payer: Self-pay

## 2018-07-05 ENCOUNTER — Inpatient Hospital Stay (HOSPITAL_COMMUNITY)
Admission: AD | Admit: 2018-07-05 | Discharge: 2018-07-05 | Disposition: A | Discharge status: Home / Self Care | Payer: BLUE CROSS/BLUE SHIELD | Attending: Obstetrics and Gynecology | Admitting: Obstetrics and Gynecology

## 2018-07-05 ENCOUNTER — Telehealth: Payer: Self-pay

## 2018-07-05 DIAGNOSIS — Z3A37 37 weeks gestation of pregnancy: Secondary | ICD-10-CM | POA: Diagnosis not present

## 2018-07-05 DIAGNOSIS — Z0371 Encounter for suspected problem with amniotic cavity and membrane ruled out: Secondary | ICD-10-CM | POA: Diagnosis not present

## 2018-07-05 HISTORY — DX: Anemia, unspecified: D64.9

## 2018-07-05 LAB — CULTURE, BETA STREP (GROUP B ONLY): STREP GP B CULTURE: NEGATIVE

## 2018-07-05 LAB — POCT FERN TEST: POCT Fern Test: NEGATIVE

## 2018-07-05 NOTE — Telephone Encounter (Signed)
Patient called and states that she lost her mucous plug over the weekend.  Patient woke up this morning stating that she leaked some clear fluid. Patient is 37 weeks and states baby is moving well. Patient advised to go to hospital now for rule out rupture. Armandina Stammer RN

## 2018-07-05 NOTE — Discharge Instructions (Signed)

## 2018-07-05 NOTE — MAU Note (Signed)
Lost her mucous plug.  ? Leaked some fluid around 0900, again this morning at 0900. Clear fluid, no bleeding. No pain.

## 2018-07-05 NOTE — MAU Provider Note (Signed)
First Provider Initiated Contact with Patient 07/05/18 1128       S: Ms. Brandi Lane is a 23 y.o. G1P0 at [redacted]w[redacted]d  who presents to MAU today complaining of leaking of fluid since this morning. She denies vaginal bleeding. She denies contractions. She reports normal fetal movement.    O: BP 111/68 (BP Location: Right Arm)   Pulse 87   Temp 98.3 F (36.8 C) (Oral)   Resp 17   Wt 79.8 kg   LMP 10/16/2017 (Exact Date)   SpO2 100%   BMI 27.16 kg/m  GENERAL: Well-developed, well-nourished female in no acute distress.  HEAD: Normocephalic, atraumatic.  CHEST: Normal effort of breathing, regular heart rate ABDOMEN: Soft, nontender, gravid PELVIC: Normal external female genitalia. Vagina is pink and rugated. Cervix with normal contour, no lesions. Normal discharge.  No pooling.   Cervical exam: deferred   Fetal Monitoring: Baseline: 140 Variability: moderate Accelerations: 15x15 Decelerations: none Contractions: none  Results for orders placed or performed during the hospital encounter of 07/05/18 (from the past 24 hour(s))  POCT fern test     Status: None   Collection Time: 07/05/18 11:52 AM  Result Value Ref Range   POCT Fern Test Negative = intact amniotic membranes      A: SIUP at [redacted]w[redacted]d  Membranes intact  P: Discharge home in stable condition  Discussed reasons to return to MAU F/u with OB as scheduled  Judeth Horn, NP 07/05/2018 12:39 PM

## 2018-07-07 ENCOUNTER — Inpatient Hospital Stay (HOSPITAL_COMMUNITY): Payer: BLUE CROSS/BLUE SHIELD | Admitting: Anesthesiology

## 2018-07-07 ENCOUNTER — Inpatient Hospital Stay (HOSPITAL_COMMUNITY)
Admission: AD | Admit: 2018-07-07 | Discharge: 2018-07-09 | DRG: 807 | Disposition: A | Discharge status: Home / Self Care | Payer: BLUE CROSS/BLUE SHIELD | Attending: Obstetrics and Gynecology | Admitting: Obstetrics and Gynecology

## 2018-07-07 ENCOUNTER — Other Ambulatory Visit: Payer: Self-pay

## 2018-07-07 ENCOUNTER — Encounter (HOSPITAL_COMMUNITY): Payer: Self-pay | Admitting: *Deleted

## 2018-07-07 DIAGNOSIS — Z87891 Personal history of nicotine dependence: Secondary | ICD-10-CM | POA: Diagnosis not present

## 2018-07-07 DIAGNOSIS — O9902 Anemia complicating childbirth: Secondary | ICD-10-CM | POA: Diagnosis present

## 2018-07-07 DIAGNOSIS — Z3400 Encounter for supervision of normal first pregnancy, unspecified trimester: Secondary | ICD-10-CM

## 2018-07-07 DIAGNOSIS — O322XX0 Maternal care for transverse and oblique lie, not applicable or unspecified: Secondary | ICD-10-CM | POA: Diagnosis present

## 2018-07-07 DIAGNOSIS — O26893 Other specified pregnancy related conditions, third trimester: Secondary | ICD-10-CM | POA: Diagnosis present

## 2018-07-07 DIAGNOSIS — O4292 Full-term premature rupture of membranes, unspecified as to length of time between rupture and onset of labor: Principal | ICD-10-CM | POA: Diagnosis present

## 2018-07-07 DIAGNOSIS — Z9889 Other specified postprocedural states: Secondary | ICD-10-CM | POA: Diagnosis not present

## 2018-07-07 DIAGNOSIS — O26899 Other specified pregnancy related conditions, unspecified trimester: Secondary | ICD-10-CM

## 2018-07-07 DIAGNOSIS — O4202 Full-term premature rupture of membranes, onset of labor within 24 hours of rupture: Secondary | ICD-10-CM

## 2018-07-07 DIAGNOSIS — D649 Anemia, unspecified: Secondary | ICD-10-CM | POA: Diagnosis present

## 2018-07-07 DIAGNOSIS — Z6791 Unspecified blood type, Rh negative: Secondary | ICD-10-CM

## 2018-07-07 DIAGNOSIS — Z3A37 37 weeks gestation of pregnancy: Secondary | ICD-10-CM | POA: Diagnosis not present

## 2018-07-07 DIAGNOSIS — Z9189 Other specified personal risk factors, not elsewhere classified: Secondary | ICD-10-CM

## 2018-07-07 DIAGNOSIS — D580 Hereditary spherocytosis: Secondary | ICD-10-CM

## 2018-07-07 DIAGNOSIS — O4290 Premature rupture of membranes, unspecified as to length of time between rupture and onset of labor, unspecified weeks of gestation: Secondary | ICD-10-CM | POA: Diagnosis present

## 2018-07-07 LAB — CBC
HCT: 36.3 % (ref 36.0–46.0)
Hemoglobin: 12.7 g/dL (ref 12.0–15.0)
MCH: 34.4 pg — ABNORMAL HIGH (ref 26.0–34.0)
MCHC: 35 g/dL (ref 30.0–36.0)
MCV: 98.4 fL (ref 80.0–100.0)
Platelets: 240 10*3/uL (ref 150–400)
RBC: 3.69 MIL/uL — ABNORMAL LOW (ref 3.87–5.11)
RDW: 19.6 % — ABNORMAL HIGH (ref 11.5–15.5)
WBC: 14 10*3/uL — ABNORMAL HIGH (ref 4.0–10.5)
nRBC: 0.4 % — ABNORMAL HIGH (ref 0.0–0.2)

## 2018-07-07 LAB — RPR: RPR Ser Ql: NONREACTIVE

## 2018-07-07 LAB — HIV ANTIBODY (ROUTINE TESTING W REFLEX): HIV SCREEN 4TH GENERATION: NONREACTIVE

## 2018-07-07 LAB — POCT FERN TEST: POCT Fern Test: POSITIVE

## 2018-07-07 LAB — OB RESULTS CONSOLE GBS: STREP GROUP B AG: NEGATIVE

## 2018-07-07 MED ORDER — LACTATED RINGERS IV SOLN: 500.0000 mL | Freq: Once | INTRAVENOUS | Status: DC

## 2018-07-07 MED ORDER — SODIUM CHLORIDE (PF) 0.9 % IJ SOLN
INTRAMUSCULAR | Status: DC | PRN
  Administered 2018-07-07: 12 mL/h via EPIDURAL

## 2018-07-07 MED ORDER — SIMETHICONE 80 MG PO CHEW: 80.0000 mg | CHEWABLE_TABLET | ORAL | Status: DC | PRN

## 2018-07-07 MED ORDER — FENTANYL CITRATE (PF) 100 MCG/2ML IJ SOLN
50.0000 ug | INTRAMUSCULAR | Status: DC | PRN
  Filled 2018-07-07: qty 2

## 2018-07-07 MED ORDER — IBUPROFEN 600 MG PO TABS
600.0000 mg | ORAL_TABLET | Freq: Four times a day (QID) | ORAL | Status: DC
  Administered 2018-07-07 – 2018-07-09 (×9): 600 mg via ORAL
  Filled 2018-07-07 (×10): qty 1

## 2018-07-07 MED ORDER — LACTATED RINGERS IV SOLN: 500.0000 mL | INTRAVENOUS | Status: DC | PRN

## 2018-07-07 MED ORDER — OXYCODONE-ACETAMINOPHEN 5-325 MG PO TABS: 1.0000 | ORAL_TABLET | ORAL | Status: DC | PRN

## 2018-07-07 MED ORDER — EPHEDRINE 5 MG/ML INJ: 10.0000 mg | INTRAVENOUS | Status: DC | PRN

## 2018-07-07 MED ORDER — MEASLES, MUMPS & RUBELLA VAC IJ SOLR: 0.5000 mL | Freq: Once | INTRAMUSCULAR | Status: DC

## 2018-07-07 MED ORDER — FENTANYL-BUPIVACAINE-NACL 0.5-0.125-0.9 MG/250ML-% EP SOLN
12.0000 mL/h | EPIDURAL | Status: DC | PRN
  Filled 2018-07-07: qty 250

## 2018-07-07 MED ORDER — METHYLERGONOVINE MALEATE 0.2 MG/ML IJ SOLN
0.2000 mg | Freq: Once | INTRAMUSCULAR | Status: AC
  Administered 2018-07-07: 0.2 mg via INTRAMUSCULAR

## 2018-07-07 MED ORDER — FENTANYL-BUPIVACAINE-NACL 0.5-0.125-0.9 MG/250ML-% EP SOLN: 12.0000 mL/h | EPIDURAL | Status: DC | PRN

## 2018-07-07 MED ORDER — DIBUCAINE 1 % RE OINT: 1.0000 "application " | TOPICAL_OINTMENT | RECTAL | Status: DC | PRN

## 2018-07-07 MED ORDER — OXYTOCIN 40 UNITS IN NORMAL SALINE INFUSION - SIMPLE MED
1.0000 m[IU]/min | INTRAVENOUS | Status: DC
  Administered 2018-07-07: 2 m[IU]/min via INTRAVENOUS

## 2018-07-07 MED ORDER — OXYTOCIN BOLUS FROM INFUSION
500.0000 mL | Freq: Once | INTRAVENOUS | Status: AC
  Administered 2018-07-07: 500 mL via INTRAVENOUS

## 2018-07-07 MED ORDER — DIPHENHYDRAMINE HCL 50 MG/ML IJ SOLN: 12.5000 mg | INTRAMUSCULAR | Status: DC | PRN

## 2018-07-07 MED ORDER — BENZOCAINE-MENTHOL 20-0.5 % EX AERO
1.0000 "application " | INHALATION_SPRAY | CUTANEOUS | Status: DC | PRN
  Filled 2018-07-07: qty 56

## 2018-07-07 MED ORDER — COCONUT OIL OIL: 1.0000 "application " | TOPICAL_OIL | Status: DC | PRN

## 2018-07-07 MED ORDER — SENNOSIDES-DOCUSATE SODIUM 8.6-50 MG PO TABS
2.0000 | ORAL_TABLET | ORAL | Status: DC
  Administered 2018-07-07 – 2018-07-09 (×2): 2 via ORAL
  Filled 2018-07-07 (×2): qty 2

## 2018-07-07 MED ORDER — FENTANYL CITRATE (PF) 100 MCG/2ML IJ SOLN
100.0000 ug | INTRAMUSCULAR | Status: DC | PRN
  Administered 2018-07-07 (×2): 100 ug via INTRAVENOUS
  Filled 2018-07-07: qty 2

## 2018-07-07 MED ORDER — DIPHENHYDRAMINE HCL 25 MG PO CAPS: 25.0000 mg | ORAL_CAPSULE | Freq: Four times a day (QID) | ORAL | Status: DC | PRN

## 2018-07-07 MED ORDER — LACTATED RINGERS IV SOLN
INTRAVENOUS | Status: DC
  Administered 2018-07-07: 500 mL via INTRAVENOUS

## 2018-07-07 MED ORDER — PHENYLEPHRINE 40 MCG/ML (10ML) SYRINGE FOR IV PUSH (FOR BLOOD PRESSURE SUPPORT): 80.0000 ug | PREFILLED_SYRINGE | INTRAVENOUS | Status: DC | PRN

## 2018-07-07 MED ORDER — OXYCODONE-ACETAMINOPHEN 5-325 MG PO TABS: 2.0000 | ORAL_TABLET | ORAL | Status: DC | PRN

## 2018-07-07 MED ORDER — SOD CITRATE-CITRIC ACID 500-334 MG/5ML PO SOLN: 30.0000 mL | ORAL | Status: DC | PRN

## 2018-07-07 MED ORDER — ONDANSETRON HCL 4 MG/2ML IJ SOLN: 4.0000 mg | Freq: Four times a day (QID) | INTRAMUSCULAR | Status: DC | PRN

## 2018-07-07 MED ORDER — PRENATAL MULTIVITAMIN CH
1.0000 | ORAL_TABLET | Freq: Every day | ORAL | Status: DC
  Administered 2018-07-08 – 2018-07-09 (×2): 1 via ORAL
  Filled 2018-07-07 (×3): qty 1

## 2018-07-07 MED ORDER — LIDOCAINE HCL (PF) 1 % IJ SOLN
INTRAMUSCULAR | Status: DC | PRN
  Administered 2018-07-07 (×2): 5 mL via EPIDURAL

## 2018-07-07 MED ORDER — ONDANSETRON HCL 4 MG/2ML IJ SOLN: 4.0000 mg | INTRAMUSCULAR | Status: DC | PRN

## 2018-07-07 MED ORDER — ONDANSETRON HCL 4 MG PO TABS: 4.0000 mg | ORAL_TABLET | ORAL | Status: DC | PRN

## 2018-07-07 MED ORDER — LIDOCAINE HCL (PF) 1 % IJ SOLN
30.0000 mL | INTRAMUSCULAR | Status: AC | PRN
  Administered 2018-07-07: 30 mL via SUBCUTANEOUS
  Filled 2018-07-07: qty 30

## 2018-07-07 MED ORDER — ACETAMINOPHEN 325 MG PO TABS: 650.0000 mg | ORAL_TABLET | ORAL | Status: DC | PRN

## 2018-07-07 MED ORDER — OXYTOCIN 40 UNITS IN NORMAL SALINE INFUSION - SIMPLE MED
2.5000 [IU]/h | INTRAVENOUS | Status: DC
  Filled 2018-07-07: qty 1000

## 2018-07-07 MED ORDER — PROMETHAZINE HCL 25 MG/ML IJ SOLN
12.5000 mg | INTRAMUSCULAR | Status: DC | PRN
  Administered 2018-07-07: 12.5 mg via INTRAVENOUS
  Filled 2018-07-07: qty 1

## 2018-07-07 MED ORDER — TERBUTALINE SULFATE 1 MG/ML IJ SOLN: 0.2500 mg | Freq: Once | INTRAMUSCULAR | Status: DC | PRN

## 2018-07-07 MED ORDER — TETANUS-DIPHTH-ACELL PERTUSSIS 5-2.5-18.5 LF-MCG/0.5 IM SUSP: 0.5000 mL | Freq: Once | INTRAMUSCULAR | Status: DC

## 2018-07-07 MED ORDER — WITCH HAZEL-GLYCERIN EX PADS: 1.0000 "application " | MEDICATED_PAD | CUTANEOUS | Status: DC | PRN

## 2018-07-07 MED ORDER — ACETAMINOPHEN 325 MG PO TABS
650.0000 mg | ORAL_TABLET | ORAL | Status: DC | PRN
  Administered 2018-07-08 (×2): 650 mg via ORAL
  Filled 2018-07-07 (×2): qty 2

## 2018-07-07 MED ORDER — METHYLERGONOVINE MALEATE 0.2 MG/ML IJ SOLN
INTRAMUSCULAR | Status: AC
  Filled 2018-07-07: qty 1

## 2018-07-07 NOTE — Discharge Summary (Addendum)
Postpartum Discharge Summary     Patient Name: Brandi Lane DOB: [birth year] MRN: [id number]  Date of admission: 07/07/2018 Delivering Provider: Gwenevere Abbot   Date of discharge: 07/09/2018  Admitting diagnosis: 37WKS CTX Intrauterine pregnancy: [redacted]w[redacted]d     Secondary diagnosis:  Principal Problem:   PROM (premature rupture of membranes) Active Problems:   Rh negative state in antepartum period   Anemia   History of episiotomy   Vacuum-assisted vaginal delivery     Discharge diagnosis: Term Pregnancy Delivered and Anemia                                                                                                Post partum procedures: Rhogam given  Augmentation: Pitocin  Complications: None  Hospital course:  Onset of Labor With Vaginal Delivery     23 y.o. yo G1P0 at [redacted]w[redacted]d was admitted in Latent Labor on 07/07/2018. Patient had an uncomplicated labor course as follows:  Membrane Rupture Time/Date: 5:00 PM ,07/06/2018   Intrapartum Procedures: Episiotomy: Median [2]                                         Lacerations:  None [1]  Patient had a delivery of a Viable infant. 07/07/2018  Information for the patient's newborn:  Brandi, Lane [[id number]]  Delivery Method: Vaginal, Spontaneous(Filed from Delivery Summary)    Pateint had an uncomplicated postpartum course. Was having some perineal pain, instructed on stool softener use. She is ambulating, tolerating a regular diet, passing flatus, and urinating well. Patient is discharged home in stable condition on 07/07/18.  Magnesium Sulfate recieved: No BMZ received: No  Physical exam  Vitals:   07/08/18 0348 07/08/18 1400 07/09/18 0021 07/09/18 0747  BP: (!) 102/57 115/60 101/67 (!) 96/58  Pulse: 71 74 78 76  Resp: 18 18 18 16   Temp: 98 F (36.7 C) 97.9 F (36.6 C) 98.1 F (36.7 C) 97.9 F (36.6 C)  TempSrc: Oral Oral Oral Oral  SpO2:    99%  Weight:      Height:       General: alert,  cooperative and no distress Lochia: appropriate Uterine Fundus: firm DVT Evaluation: No evidence of DVT seen on physical exam.  Labs: Lab Results  Component Value Date   WBC 14.0 (H) 07/07/2018   HGB 12.7 07/07/2018   HCT 36.3 07/07/2018   MCV 98.4 07/07/2018   PLT 240 07/07/2018   CMP Latest Ref Rng & Units 08/02/2016  Glucose 65 - 99 mg/dL 416(S)  BUN 6 - 20 mg/dL 10  Creatinine 0.63 - 0.16 mg/dL 0.10  Sodium 932 - 355 mmol/L 137  Potassium 3.5 - 5.1 mmol/L 3.2(L)  Chloride 101 - 111 mmol/L 104  CO2 22 - 32 mmol/L 23  Calcium 8.9 - 10.3 mg/dL 9.7  Total Protein 6.5 - 8.1 g/dL 6.9  Total Bilirubin 0.3 - 1.2 mg/dL 4.7(H)  Alkaline Phos 38 - 126 U/L 66  AST 15 - 41 U/L 19  ALT  14 - 54 U/L 14   Discharge instruction: per After Visit Summary and "Baby and Me Booklet".  After visit meds:  Allergies as of 07/09/2018      Reactions   Sulfasalazine Other (See Comments)   Family is allergic, pt has never had it but was told to never take it   Sulfa Antibiotics    Mother and grandmother are allergic       Medication List    STOP taking these medications   folic acid 1 MG tablet Commonly known as:  FOLVITE     TAKE these medications   ferrous sulfate 325 (65 FE) MG tablet Take 325 mg by mouth daily with breakfast.   ibuprofen 600 MG tablet Commonly known as:  ADVIL,MOTRIN Take 1 tablet (600 mg total) by mouth every 6 (six) hours.   multivitamin-prenatal 27-0.8 MG Tabs tablet Take 1 tablet by mouth daily at 12 noon.      Diet: routine diet  Activity: Advance as tolerated. Pelvic rest for 6 weeks.   Outpatient follow up:6 weeks Follow up Appt: Future Appointments  Date Time Provider Department Center  07/08/2018 10:00 AM Levie Heritage, DO [facility] None   Follow up Visit: Please schedule this patient for Postpartum visit in: 6 weeks with the following provider: Any provider For C/S patients schedule nurse incision check in weeks 2 weeks: no Low risk  pregnancy complicated by: spherocytosis  Delivery mode:  Vacuum with eventual SVD Anticipated Birth Control:  other/unsure PP Procedures needed: None  Schedule Integrated BH visit: no  Newborn Data: Live born female  Birth Weight:   APGAR: , 9  Newborn Delivery   Birth date/time:  07/07/2018 13:18:00 Delivery type:  Vaginal, Spontaneous    Baby Feeding: Breast Disposition:home with mother  Burman Nieves, MD Faculty Service, Resident   OB FELLOW DISCHARGE ATTESTATION  I have seen and examined this patient and agree with above documentation in the resident's note.   Siriyah Ambrosius,MD OB Fellow  07/09/2018, 9:45 AM

## 2018-07-07 NOTE — MAU Note (Signed)
PT SAYS  SHE WAS HERE ON Monday-  FOR SROM- NEG-  NO VE.  NO VE IN OFFICE . [facility]-  [address]   DENIES HSV AND  MRSA.  GBS-   UC  BAD X1 HR.

## 2018-07-07 NOTE — Anesthesia Postprocedure Evaluation (Signed)
Anesthesia Post Note  Patient: Brandi Lane  Procedure(s) Performed: AN AD HOC LABOR EPIDURAL     Patient location during evaluation: Mother Baby Anesthesia Type: Epidural Level of consciousness: awake Pain management: satisfactory to patient Vital Signs Assessment: post-procedure vital signs reviewed and stable Respiratory status: spontaneous breathing Cardiovascular status: stable Anesthetic complications: no    Last Vitals:  Vitals:   07/07/18 1510 07/07/18 1610  BP: 112/72 128/66  Pulse: 75 75  Resp: 18 18  Temp: 36.4 C 36.9 C  SpO2: 98% 99%    Last Pain:  Vitals:   07/07/18 1610  TempSrc: Oral  PainSc: 0-No pain   Pain Goal:                   [employer]

## 2018-07-07 NOTE — Anesthesia Procedure Notes (Signed)
Epidural Patient location during procedure: OB Start time: 07/07/2018 8:03 AM End time: 07/07/2018 8:12 AM  Staffing Anesthesiologist: Achille Rich, MD Performed: anesthesiologist   Preanesthetic Checklist Completed: patient identified, site marked, pre-op evaluation, timeout performed, IV checked, risks and benefits discussed and monitors and equipment checked  Epidural Patient position: sitting Prep: DuraPrep Patient monitoring: heart rate, cardiac monitor, continuous pulse ox and blood pressure Approach: midline Location: L2-L3 Injection technique: LOR saline  Needle:  Needle type: Tuohy  Needle gauge: 17 G Needle length: 9 cm Needle insertion depth: 5 cm Catheter type: closed end flexible Catheter size: 19 Gauge Catheter at skin depth: 11 cm Test dose: negative and Other  Assessment Events: blood not aspirated, injection not painful, no injection resistance and negative IV test  Additional Notes Informed consent obtained prior to proceeding including risk of failure, 1% risk of PDPH, risk of minor discomfort and bruising.  Discussed rare but serious complications including epidural abscess, permanent nerve injury, epidural hematoma.  Discussed alternatives to epidural analgesia and patient desires to proceed.  Timeout performed pre-procedure verifying patient name, procedure, and platelet count.  Patient tolerated procedure well. Reason for block:procedure for pain

## 2018-07-07 NOTE — H&P (Signed)
Brandi Lane is a 23 y.o. female presenting for painful uterine contractions.  Has been leaking since 1700hrs.  Had another gush in MAU, clear fluid  Followed by Dr Adrian Blackwater in [facility] office Patient Active Problem List   Diagnosis Date Noted  . PROM (premature rupture of membranes) 07/07/2018  . Rh negative state in antepartum period 01/08/2018  . Supervision of normal first pregnancy, antepartum 12/11/2017  . Spherocytosis (familial) (HCC) 12/11/2017   . OB History    Gravida  1   Para      Term      Preterm      AB      Living        SAB      TAB      Ectopic      Multiple      Live Births             Past Medical History:  Diagnosis Date  . Anemia   . History of blood transfusion 2013  . Spherocytosis [facility])    Past Surgical History:  Procedure Laterality Date  . WISDOM TOOTH EXTRACTION     Family History: family history is not on file. Social History:  reports that she quit smoking about 4 years ago. She has never used smokeless tobacco. She reports previous alcohol use. She reports that she does not use drugs.     Maternal Diabetes: No Genetic Screening: Declined Maternal Ultrasounds/Referrals: Normal Fetal Ultrasounds or other Referrals:  None Maternal Substance Abuse:  No Significant Maternal Medications:  None Significant Maternal Lab Results:  Lab values include: Group B Strep negative Other Comments:  maternal spherocytosis, on iron supplements  Review of Systems  Constitutional: Negative for chills and fever.  Eyes: Negative for blurred vision.  Respiratory: Negative for shortness of breath.   Cardiovascular: Negative for leg swelling.  Gastrointestinal: Positive for abdominal pain. Negative for constipation, diarrhea, nausea and vomiting.  Musculoskeletal: Negative for back pain.   Maternal Medical History:  Reason for admission: Rupture of membranes and contractions.  Nausea.  Contractions: Onset was 6-12 hours ago.    Frequency: regular.   Perceived severity is strong.    Fetal activity: Perceived fetal activity is normal.   Last perceived fetal movement was within the past hour.    Prenatal complications: No bleeding, PIH, placental abnormality, pre-eclampsia or preterm labor.   Prenatal Complications - Diabetes: none.    Dilation: 1.5 Effacement (%): 100 Station: -1 Exam by:: Lauren Cox RN  Blood pressure 116/69, pulse 82, resp. rate 18, height 5\' 7"  (1.702 m), weight 79.4 kg, last menstrual period 10/16/2017. Maternal Exam:  Uterine Assessment: Contraction strength is firm.  Contraction frequency is regular.   Abdomen: Patient reports no abdominal tenderness. Estimated fetal weight is 7.   Fetal presentation: vertex  Introitus: Normal vulva. Normal vagina.  Ferning test: positive.  Nitrazine test: not done. Amniotic fluid character: clear.  Pelvis: adequate for delivery.   Cervix: Cervix evaluated by digital exam.     Fetal Exam Fetal Monitor Review: Mode: ultrasound.   Baseline rate: 140.  Variability: moderate (6-25 bpm).   Pattern: accelerations present and no decelerations.    Fetal State Assessment: Category I - tracings are normal.     Physical Exam  Constitutional: She is oriented to person, place, and time. She appears well-developed and well-nourished. No distress.  HENT:  Head: Normocephalic.  Cardiovascular: Normal rate and regular rhythm.  Respiratory: No respiratory distress. She has no wheezes.  She has no rales.  GI: Soft. She exhibits no distension. There is no abdominal tenderness. There is no rebound and no guarding.  Genitourinary:    Vulva normal.     Genitourinary Comments: Dilation: 1.5 Effacement (%): 100 Station: -1 Presentation: Vertex Exam by:: Lauren Cox RN     Musculoskeletal: Normal range of motion.  Neurological: She is alert and oriented to person, place, and time.  Skin: Skin is warm and dry.  Psychiatric: She has a normal mood and  affect.    Prenatal labs: ABO, Rh: O/Negative/-- (08/30 1035) Antibody: Negative (08/30 1035) Rubella: 3.38 (08/30 1035) RPR: Non Reactive (01/17 0936)  HBsAg: Negative (08/30 1035)  HIV: Non Reactive (01/17 0936)  GBS:     Assessment/Plan: SIngle intrauterine pregnancy at [redacted]w[redacted]d Early active labor Premature rupture of membranes  Admit to Labor and Delivery Routine orders Wants epidural, will start with Fentanyl and if it does not help, will get epidural Anticipate SVD of "[name]"   Wynelle Bourgeois 07/07/2018, 4:41 AM

## 2018-07-07 NOTE — MAU Note (Signed)
Pt noticed fluid running down her leg when taken to MAU room. Fern slide was positive. I asked her when she thinks she started leaking fluid and she said she was unsure but is thinking maybe @1700  07/06/18 but she was still unsure of time because she thought she was peeing on herself.

## 2018-07-07 NOTE — Anesthesia Preprocedure Evaluation (Signed)
Anesthesia Evaluation  Patient identified by MRN, date of birth, ID band Patient awake    Reviewed: Allergy & Precautions, H&P , NPO status , Patient's Chart, lab work & pertinent test results  Airway Mallampati: II   Neck ROM: full    Dental   Pulmonary former smoker,    breath sounds clear to auscultation       Cardiovascular negative cardio ROS   Rhythm:regular Rate:Normal     Neuro/Psych    GI/Hepatic   Endo/Other    Renal/GU      Musculoskeletal   Abdominal   Peds  Hematology  (+) Blood dyscrasia, , H/o spherocytosis   Anesthesia Other Findings   Reproductive/Obstetrics (+) Pregnancy                             Anesthesia Physical Anesthesia Plan  ASA: II  Anesthesia Plan: Epidural   Post-op Pain Management:    Induction: Intravenous  PONV Risk Score and Plan: 2 and Treatment may vary due to age or medical condition  Airway Management Planned: Natural Airway  Additional Equipment:   Intra-op Plan:   Post-operative Plan:   Informed Consent: I have reviewed the patients History and Physical, chart, labs and discussed the procedure including the risks, benefits and alternatives for the proposed anesthesia with the patient or authorized representative who has indicated his/her understanding and acceptance.       Plan Discussed with: Anesthesiologist  Anesthesia Plan Comments:         Anesthesia Quick Evaluation

## 2018-07-07 NOTE — Progress Notes (Signed)
LABOR PROGRESS NOTE  SHASHA REHL is a 23 y.o. G1P0 at [redacted]w[redacted]d  admitted for srom.  Subjective: Pushing for about 2 hours, painful. No bleeding.   Objective: BP 118/76   Pulse 96   Temp 98.7 F (37.1 C) (Axillary)   Resp 18   Ht 5\' 7"  (1.702 m)   Wt 79.4 kg   LMP 10/16/2017 (Exact Date)   SpO2 98%   BMI 27.41 kg/m  or  Vitals:   07/07/18 1130 07/07/18 1140 07/07/18 1200 07/07/18 1212  BP: (!) 115/58  139/72 118/76  Pulse: 71  83 96  Resp:  (!) 22 18   Temp:  98.7 F (37.1 C)    TempSrc:  Axillary    SpO2:      Weight:      Height:         Dilation: 10 Dilation Complete Date: 07/07/18 Dilation Complete Time: 1000 Effacement (%): 100 Cervical Position: Middle Station: 0 Presentation: Vertex Exam by:: sowder rnc 150/mod/-a/early decels Labs: Lab Results  Component Value Date   WBC 14.0 (H) 07/07/2018   HGB 12.7 07/07/2018   HCT 36.3 07/07/2018   MCV 98.4 07/07/2018   PLT 240 07/07/2018    Patient Active Problem List   Diagnosis Date Noted  . PROM (premature rupture of membranes) 07/07/2018  . Rh negative state in antepartum period 01/08/2018  . Supervision of normal first pregnancy, antepartum 12/11/2017  . Deviated nasal septum 11/24/2015  . FH: hemochromatosis 01/18/2013  . Anemia 01/02/2012  . Jaundice 01/02/2012  . Hereditary spherocytosis (HCC) 07/14/2011    Assessment / Plan: 23 y.o. G1P0 at [redacted]w[redacted]d here for srom, now with protracted second stage  Labor: slow progress second stage, currently at plus 2 station, from reported plus 1 at start. Infant appears to be transverse. Maternal effort sub-optimal, have started oxytocin for augmentation. Fetal status reassuring for now. Consider operative delivery if no further progress over next 30-60 min. Fetal Wellbeing:  Cat 1 Pain Control:  epidural Anticipated MOD:  Hopeful for vaginal  Silvano Bilis, MD 07/07/2018, 12:30 PM

## 2018-07-08 ENCOUNTER — Telehealth: Payer: BLUE CROSS/BLUE SHIELD | Admitting: Family Medicine

## 2018-07-08 MED ORDER — RHO D IMMUNE GLOBULIN 1500 UNIT/2ML IJ SOSY
300.0000 ug | PREFILLED_SYRINGE | Freq: Once | INTRAMUSCULAR | Status: AC
  Administered 2018-07-08: 300 ug via INTRAVENOUS
  Filled 2018-07-08: qty 2

## 2018-07-08 NOTE — Progress Notes (Signed)
Patient ID: Brandi Lane, female   DOB: November 01, 1995, 22 y.o.   MRN: [id number] Late entry for 07/07/2018  Progressed cervix to almost 3cm Has had two doses of Fentanyl and feels it is no longer helping her Labor pain has moved from anterior to back  Wants epidural FHR is stable and reassuring UCs are frequent and painful  Will authorize epidural

## 2018-07-08 NOTE — Lactation Note (Signed)
This note was copied from a baby's chart. Lactation Consultation Note  Patient Name: Brandi Lane [id number] Date: 07/08/2018 Reason for consult: Initial assessment;Mother's request;Difficult latch;1st time breastfeeding;Early term 37-38.6wks P1, 17 hour female infant. Per parents, infant had emesis (clear mucous) and sounds nasal. Infant reluctant to breastfeed ( latch at this time) .  Mom hand expressed and infant was given 6 ml of colostrum  by spoon. Mom has inverted nipples both breast. Mom given a 20 mm NS and has started using DEBP, mom pumped 3 times for 15 minutes yesterday.  Mom shown how to use DEBP & how to disassemble, clean, & reassemble parts. Mom knows to breastfeed according hunger cues, 8 or more times within 24 hours. LC discussed I & O. Reviewed Baby & Me book's Breastfeeding Basics.  Mom made aware of O/P services, breastfeeding support groups, community resources, and our phone # for post-discharge questions.  Mom knows to call Nurse or LC if she has any questions, concerns or need assistance with latching infant to breast. Mom made aware of O/P services, breastfeeding support groups, community resources, and our phone # for post-discharge questions.  Maternal Data Formula Feeding for Exclusion: No Has patient been taught Hand Expression?: Yes Does the patient have breastfeeding experience prior to this delivery?: Yes  Feeding Feeding Type: Breast Milk  LATCH Score                   Interventions Interventions: Breast feeding basics reviewed;Assisted with latch;Pre-pump if needed;Breast compression;Adjust position;Support pillows;Position options;Expressed milk;Hand pump;DEBP  Lactation Tools Discussed/Used WIC Program: No Pump Review: Setup, frequency, and cleaning;Milk Storage Initiated by:: (Nurse) Date initiated:: 07/07/18   Consult Status Consult Status: Follow-up Date: 07/08/18 Follow-up type: In-patient    Danelle Earthly 07/08/2018, 6:56 AM

## 2018-07-08 NOTE — Progress Notes (Signed)
Post Partum Day 1 Subjective: no complaints, up ad lib, voiding and tolerating PO  Objective: Blood pressure (!) 102/57, pulse 71, temperature 98 F (36.7 C), temperature source Oral, resp. rate 18, height 5\' 7"  (1.702 m), weight 79.4 kg, last menstrual period 10/16/2017, SpO2 99 %, unknown if currently breastfeeding.  Physical Exam:  General: alert, cooperative and no distress Lochia: appropriate Uterine Fundus: firm Incision: perineum healing, tender DVT Evaluation: No evidence of DVT seen on physical exam.  Recent Labs    07/07/18 0449  HGB 12.7  HCT 36.3    Assessment/Plan: Plan for discharge tomorrow and Breastfeeding   LOS: 1 day   Wynelle Bourgeois 07/08/2018, 6:47 AM

## 2018-07-09 LAB — RH IG WORKUP (INCLUDES ABO/RH)
ABO/RH(D): O NEG
Fetal Screen: NEGATIVE
Gestational Age(Wks): 37.5
Unit division: 0
Unit tag comment: 37.5

## 2018-07-09 MED ORDER — IBUPROFEN 600 MG PO TABS: 600.0000 mg | ORAL_TABLET | Freq: Four times a day (QID) | ORAL | 0 refills | Status: DC

## 2018-07-09 MED ORDER — HYDROXYZINE HCL 10 MG PO TABS: 10.0000 mg | ORAL_TABLET | Freq: Every day | ORAL | 0 refills | Status: DC | PRN

## 2018-07-09 MED ORDER — SERTRALINE HCL 25 MG PO TABS: 25.0000 mg | ORAL_TABLET | Freq: Every day | ORAL | 0 refills | Status: DC

## 2018-07-09 NOTE — Discharge Instructions (Signed)

## 2018-07-09 NOTE — Lactation Note (Signed)
This note was copied from a baby's chart. Lactation Consultation Note  Patient Name: Brandi Lane GGEZM'O Date: 07/09/2018  Infant is 83 hrs old. "[name]" is an ETI w/a cephalohematoma. Infant's bili level is in HIRZ.   It had been 4 hours since infant last fed. Infant was put to the breast with the nipple shield Mom had been using, but was not interested in feeding, despite pre-filling the nipple shield with EBM. Changing Mom to a nipple shield that was more comfortable for her & more pliable for infant also did not help.    Mom became tearful as she was worried about infant's weight loss and breastfeeding "was hard." Mom was given option of pumping & BO for the time being & she agreed.  Infant drank comfortably with the Similac slow-flow nipple.   I washed Mom's pump parts for her. Mom reports that her breasts look bigger today & to palpation, her breasts appear to be filling.  Lurline [facility] 07/09/2018, 10:31 AM

## 2018-07-09 NOTE — Progress Notes (Signed)
Met with patient this afternoon to discuss mood and hx of depression. Reports feeling very anxious since delivery, tearful throughout today and concerned about post partum depression. Patient says she has a history of depression and stopped taking zoloft 40m shortly after finding out she was pregnant. She would like to restart today. FOB is very supportive of her and is aware of her history of depression. Discussed post partum blues vs PPD. Will prescribe 284mZoloft daily, may need to be titrated up. Also prescribed 1033mydroxyzine to be taken occasionally as needed for anxiety. [facility]. Also discussed return precautions and feelings/symptoms which would require urgent medical evaluation including but not limited to wanting to harm or kill herself, wanting to harm her baby or not being able to care for baby.   JulOrlene PlumD Faculty Service, Resident

## 2018-07-09 NOTE — Progress Notes (Signed)
  Etheleen Mayhew, RN  Registered Nurse  Pediatrics  Progress Notes  Signed  Date of Service:  07/09/2018 5:28 PM          Signed         Show:Clear all [x] Manual[] Template[] Copied  Added by: [x] Etheleen Mayhew, RN  [] Hover for details Mother crying off and all day. Expressing a lot of concern about baby. Finally stated her primary concern is that her baby might have spherocytosis like she does. Remembers her childhood being difficult because of the spherocytosis. Support given. Talked with her about having been on Zoloft before the pregnancy and asked her if she felt she needed to be on it again. Patient states she thinks she does. Talked with Dr Darin Engels about getting her a prescription for the Zoloft before discharge. Dr Darin Engels to come up to talk her about Zoloft.

## 2018-07-09 NOTE — Lactation Note (Addendum)
This note was copied from a baby's chart. Lactation Consultation Note  Patient Name: Brandi Lane [id number] Date: 07/09/2018    Mom requested that I return to room. Mom was crying & stated that she was concerned that infant wasn't bottle feeding well. Infant had drunk from the bottle well twice with me earlier in the day.   After talking with Mom, it became apparent that she was having infant eat q 2hrs when infant was not showing any cues. I reframed feeding expectations for Mom as feed 8-12 times in 24 hrs. It's OK, if on occasion, infant goes 4 hours between feeds. I explained that I expected that infant will eat better and consume more when she is ready to eat.  Mom had been on sertraline prior to pregnancy, but had gone off of it prior to pregnancy b/c she didn't think it was helpful. Mom admits to being anxious & reports recent behaviors that sound like hypervigilance (e.g. checking to see if baby is breathing). Mom also says that it is difficult for her to fall asleep. Mom cried throughout consult & also talked about how difficult delivery had been. I did therapeutic communication with Mom.  Alvino Chapel, RN was made aware of the above.  Lurline Hare [facility] 07/09/2018, 4:10 PM

## 2018-07-10 ENCOUNTER — Ambulatory Visit: Payer: Self-pay

## 2018-07-10 NOTE — Lactation Note (Signed)
This note was copied from a baby's chart. Lactation Consultation Note  Patient Name: Brandi Lane RCBUL'A Date: 07/10/2018   Mom is in much better spirits today. Infant has gained 120g & Mom's milk is coming to volume (she was most recently able to pump 24mL).   Mom plans to use Dr. Theora Gianotti Newborn or Preemie nipple at home, but also has Level 1. Paced feeding & positioning with bottle feeding was discussed/shown to Dad.  Parents have our phone # if they need to reach [place] post-discharge. Lurline Hare [facility] 07/10/2018, 8:25 AM

## 2018-07-11 LAB — TYPE AND SCREEN
ABO/RH(D): O NEG
Antibody Screen: POSITIVE
Unit division: 0
Unit division: 0

## 2018-07-11 LAB — BPAM RBC
Blood Product Expiration Date: 202004222359
Blood Product Expiration Date: 202004232359
Unit Type and Rh: 9500
Unit Type and Rh: 9500

## 2018-07-12 NOTE — Telephone Encounter (Signed)
Mother called stating she has had nausea, vomiting, some loose stools and constipation which she was told could be due to breastfeeding.  Mother is pumping exclusively.  Mother is hydrating as best she can.  Discussed Emetrol medication which is safe for breastfeeding.  Encouraged mother to call her OB/GYN is vomiting increases.

## 2018-07-13 MED ORDER — HYDROXYZINE HCL 10 MG PO TABS: 10.0000 mg | ORAL_TABLET | Freq: Every day | ORAL | 3 refills | Status: DC | PRN

## 2018-07-13 NOTE — Telephone Encounter (Signed)
Called patient on the phone. Nausea and vomiting improved. Has increased anxiety at night. Is on Zoloft. Will increase vistaril to 10-20mg  prn.

## 2018-07-13 NOTE — Addendum Note (Signed)
Addended by: Levie Heritage on: 07/13/2018 08:43 AM   Modules accepted: Orders

## 2018-08-04 ENCOUNTER — Other Ambulatory Visit: Payer: Self-pay

## 2018-08-04 ENCOUNTER — Ambulatory Visit (INDEPENDENT_AMBULATORY_CARE_PROVIDER_SITE_OTHER): Payer: BLUE CROSS/BLUE SHIELD | Admitting: Clinical

## 2018-08-04 DIAGNOSIS — F4323 Adjustment disorder with mixed anxiety and depressed mood: Secondary | ICD-10-CM | POA: Diagnosis not present

## 2018-08-04 NOTE — BH Specialist Note (Signed)
Integrated Behavioral Health Initial Visit  MRN: [id number] Name: Brandi Lane  Number of Integrated Behavioral Health Clinician visits:: 1/6 Session Start time: 2:00  Session End time: 2:31 Total time: 30 minutes  Type of Service: Integrated Behavioral Health- Individual/Family Interpretor:No. Interpretor Name and Language: n/a   Warm Hand Off Completed.       SUBJECTIVE: Brandi Lane is a 23 y.o. female accompanied by n/a Patient was referred by Cam Hai, CNM for depression and anxiety postpartum Patient reports the following symptoms/concerns: Pt states her primary concern is wanting to prevent escalating anxiety and depression postpartum, as she adjusts to new motherhood. Pt feels the Zoloft and Hydroxyzine,  along with self-coping strategies, have helped her to feel in better control of her emotions; is open to learning an additional self-coping strategy today.  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 Maintain reduction of symptoms of: anxiety and depression 2. Increase knowledge and/or ability of: coping skills  3. 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   Patient may benefit from psychoeducation and brief therapeutic interventions regarding coping with symptoms of depression and anxiety .  PLAN: 1. Follow up with behavioral health clinician on : As needed, if symptoms increase 2. Behavioral recommendations:  -Continue taking medication as prescribed -Continue using daily self-coping strategies; consider adding daily preventative strategy of choice, as discussed, every morning -Continue participating in FB new mom group; consider other online new mom groups as needed [facility] Mom Talk online; www.postpartum.net groups) 3. Referral(s): Integrated Art gallery manager (In Clinic) and [employer]:  New mom online groups 4. "From scale of 1-10, how likely are you to follow plan?": 10  Blakeley Margraf C Xochilt Conant, LCSW

## 2018-08-05 MED ORDER — SERTRALINE HCL 25 MG PO TABS: 25.0000 mg | ORAL_TABLET | Freq: Every day | ORAL | 3 refills | Status: DC

## 2018-08-05 NOTE — Addendum Note (Signed)
Addended by: Levie Heritage on: 08/05/2018 08:14 AM   Modules accepted: Orders

## 2018-08-10 DIAGNOSIS — M25532 Pain in left wrist: Secondary | ICD-10-CM | POA: Insufficient documentation

## 2018-08-11 DIAGNOSIS — S63502A Unspecified sprain of left wrist, initial encounter: Secondary | ICD-10-CM | POA: Insufficient documentation

## 2018-08-16 ENCOUNTER — Other Ambulatory Visit: Payer: Self-pay

## 2018-08-16 ENCOUNTER — Encounter: Payer: Self-pay | Admitting: Family Medicine

## 2018-08-16 ENCOUNTER — Ambulatory Visit (INDEPENDENT_AMBULATORY_CARE_PROVIDER_SITE_OTHER): Payer: BLUE CROSS/BLUE SHIELD | Admitting: Family Medicine

## 2018-08-16 DIAGNOSIS — Z3202 Encounter for pregnancy test, result negative: Secondary | ICD-10-CM

## 2018-08-16 DIAGNOSIS — Z30430 Encounter for insertion of intrauterine contraceptive device: Secondary | ICD-10-CM

## 2018-08-16 DIAGNOSIS — F4323 Adjustment disorder with mixed anxiety and depressed mood: Secondary | ICD-10-CM

## 2018-08-16 LAB — POCT URINE PREGNANCY: Preg Test, Ur: NEGATIVE

## 2018-08-16 MED ORDER — LEVONORGESTREL 19.5 MCG/DAY IU IUD
INTRAUTERINE_SYSTEM | Freq: Once | INTRAUTERINE | Status: AC
  Administered 2018-08-16: 1 via INTRAUTERINE

## 2018-08-16 NOTE — Progress Notes (Signed)
Subjective:     Brandi Lane is a 23 y.o. female who presents for a postpartum visit. She is 5 weeks postpartum following a spontaneous vaginal delivery. I have fully reviewed the prenatal and intrapartum course. The delivery was at 37 gestational weeks. Outcome: spontaneous vaginal delivery. Anesthesia: epidural. Postpartum course has been normal. Baby's course has been normal. Baby is feeding by breast. Bleeding staining only. Bowel function is abnormal: constipation . Bladder function is normal. Patient is not sexually active. Contraception method is IUD. Postpartum depression screening: negative.  The following portions of the patient's history were reviewed and updated as appropriate: allergies, current medications, past family history, past medical history, past social history, past surgical history and problem list.  Review of Systems Pertinent items are noted in HPI.   Objective:    BP 123/74   Pulse 69   Wt 151 lb (68.5 kg)   BMI 23.65 kg/m   General:  alert and cooperative  Lungs: clear to auscultation bilaterally  Heart:  regular rate and rhythm, S1, S2 normal, no murmur, click, rub or gallop  Abdomen: soft, non-tender; bowel sounds normal; no masses,  no organomegaly   Vulva:  normal  Vagina: normal vagina, no discharge, exudate, lesion, or erythema. Episiotomy repair well healed.  Cervix:  multiparous appearance        IUD Procedure Note Patient identified, informed consent performed, signed copy in chart, time out was performed.  Urine pregnancy test negative.  Speculum placed in the vagina.  Cervix visualized.  Cleaned with Betadine x 2.  Grasped anteriorly with a single tooth tenaculum.  Uterus sounded to 7 cm.  Liletta  IUD placed per manufacturer's recommendations.  Strings trimmed to 3 cm. Tenaculum was removed, good hemostasis noted.  Patient tolerated procedure well.   Patient given post procedure instructions and Liletta care card with expiration date.   Patient is asked to check IUD strings periodically and follow up in 4-6 weeks for IUD check.    Assessment:     Normal postpartum exam. Pap smear not done at today's visit.   Plan:    1. Contraception: IUD 2. Continue zoloft 3. Follow up in: 4 weeks or as needed.

## 2018-08-23 ENCOUNTER — Telehealth: Payer: Self-pay | Admitting: Obstetrics & Gynecology

## 2018-08-23 DIAGNOSIS — N61 Mastitis without abscess: Secondary | ICD-10-CM

## 2018-08-23 MED ORDER — DICLOXACILLIN SODIUM 500 MG PO CAPS: 500.0000 mg | ORAL_CAPSULE | Freq: Four times a day (QID) | ORAL | 0 refills | Status: DC

## 2018-08-23 NOTE — Telephone Encounter (Signed)
TC to pt. No answer. VM full. Unable to leave msg.  clh-S

## 2018-08-23 NOTE — Telephone Encounter (Signed)
Pt felt fever and chills 3 days ago. At the same time she began to feel breast tenderness and then she developed a red lump on her breast. NO fever or chills now. She has no pain but, there is tenderness with touching and with nursing. Pt is still able to nurse from the that right side after the baby nurses for awhile the discomfort improves. The is a golf ball sized lump that is tender to palpation. It is not soft. Pt denies URI sx. She was Covid testing by her primary provider earlier today. Pt has gone to the grocery store a few times but, she wears a mask and gloves.    I have discussed with pt the possibility of mastitis. She is allergic to Sulfa. Will treat with a atbx and follow closely. Pt to have virtual visit in 3 days. She was counseling to call back sooner prn chage in sx.    All question answered.   Precious Gilchrest L. Harraway-Smith, M.D., Evern Core

## 2018-08-24 ENCOUNTER — Other Ambulatory Visit: Payer: Self-pay

## 2018-08-24 DIAGNOSIS — N61 Mastitis without abscess: Secondary | ICD-10-CM

## 2018-08-24 MED ORDER — DICLOXACILLIN SODIUM 500 MG PO CAPS: 500.0000 mg | ORAL_CAPSULE | Freq: Four times a day (QID) | ORAL | 0 refills | Status: DC

## 2018-08-26 ENCOUNTER — Ambulatory Visit (INDEPENDENT_AMBULATORY_CARE_PROVIDER_SITE_OTHER): Payer: BLUE CROSS/BLUE SHIELD | Admitting: Obstetrics & Gynecology

## 2018-08-26 DIAGNOSIS — O9279 Other disorders of lactation: Secondary | ICD-10-CM

## 2018-08-26 DIAGNOSIS — O9122 Nonpurulent mastitis associated with the puerperium: Secondary | ICD-10-CM

## 2018-08-26 NOTE — Progress Notes (Signed)
TELEHEALTH WEBEX POSTPARTUM VISIT ENCOUNTER NOTE  I connected with@ on 08/26/18 at  4:00 PM EDT via WebEx at home and verified that I am speaking with the correct person using two identifiers.   I discussed the limitations, risks, security and privacy concerns of performing an evaluation and management service by telephone and the availability of in person appointments. I also discussed with the patient that there may be a patient responsible charge related to this service. The patient expressed understanding and agreed to proceed.  Appointment Date: 08/26/2018  OBGYN Clinic: [facility]  Chief Complaint: mastitis vs clogged milk duct   History of Present Illness: Brandi Lane is a 23 y.o. Caucasian G1P1001 (No LMP recorded.), seen for the above chief complaint. Her past medical history is significant for SVD 6-7 weeks prev   Complains of breast tenderness on the right that has been occurring since ast week. She initially had a fever and chills and was tested for Covid. She was prescribed atbx last week but, due to a delay with the pharmacy, she was just able to begin the atbx yeterday. Pt is nursing ever 1 1/2 - 3 hours. Pt is still able to nurse on both sides.  There is a small lump in the right breast. She has tried to use warm compresses to no avial.    Review of Systems: [place] Her 12 point review of systems is negative or as noted in the History of Present Illness.  Patient Active Problem List   Diagnosis Date Noted  . Deviated nasal septum 11/24/2015  . FH: hemochromatosis 01/18/2013  . Anemia 01/02/2012  . Jaundice 01/02/2012  . Hereditary spherocytosis (HCC) 07/14/2011    Medications [name] had no medications administered during this visit. Current Outpatient Medications  Medication Sig Dispense Refill  . dicloxacillin (DYNAPEN) 500 MG capsule Take 1 capsule (500 mg total) by mouth 4 (four) times daily. 40 capsule 0  . docusate sodium (COLACE) 100 MG  capsule Take 100 mg by mouth 2 (two) times daily.    . Prenatal Vit-Fe Fumarate-FA (MULTIVITAMIN-PRENATAL) 27-0.8 MG TABS tablet Take 1 tablet by mouth daily at 12 noon.    . sertraline (ZOLOFT) 25 MG tablet Take 1 tablet (25 mg total) by mouth daily. 90 tablet 3  . ferrous sulfate 325 (65 FE) MG tablet Take 325 mg by mouth daily with breakfast.     No current facility-administered medications for this visit.     Allergies Sulfasalazine and Sulfa antibiotics  Physical Exam:  There were no vitals taken for this visit. General:  Alert, oriented and cooperative. Patient is in no acute distress.  Mental Status: Normal mood and affect. Normal behavior. Normal judgment and thought content.   Respiratory: Normal respiratory effort noted, no problems with respiration noted  Rest of physical exam deferred due to type of encounter  Assessment:Patient is a 23 y.o. G1P1001 who is 7 weeks postpartum.  She is doing well except with nursing.  Mastitis    Plan: Keep Dicloxcillin F/u via telephone in 1 week to update [place] on your progress. Call sooner prn Reviewed instructions for nursing with mastitis.     I discussed the assessment and treatment plan with the patient. The patient was provided an opportunity to ask questions and all were answered. The patient agreed with the plan and demonstrated an understanding of the instructions.   The patient was advised to call back or seek an in-person evaluation/go to the ED for any concerning  postpartum symptoms.  I provided 12 minutes of face-to-face time during this encounter.   Willodean Rosenthalarolyn Harraway-Smith, MD [facility], [facility]

## 2018-08-26 NOTE — Progress Notes (Signed)
Patient identified by 2 patient identifiers and agrees to web.  Patient states she had chills and achy, fever 100.1 F temp on Thursday - Saturday. Patient complaining of red hard spot on Saturday.  Armandina Stammer RN

## 2018-08-27 ENCOUNTER — Encounter: Payer: Self-pay | Admitting: Obstetrics & Gynecology

## 2018-09-16 ENCOUNTER — Ambulatory Visit (INDEPENDENT_AMBULATORY_CARE_PROVIDER_SITE_OTHER): Payer: BC Managed Care – PPO | Admitting: Family Medicine

## 2018-09-16 ENCOUNTER — Encounter: Payer: Self-pay | Admitting: Family Medicine

## 2018-09-16 ENCOUNTER — Other Ambulatory Visit: Payer: Self-pay

## 2018-09-16 VITALS — BP 120/71 | HR 79 | Wt 149.0 lb

## 2018-09-16 DIAGNOSIS — Z30431 Encounter for routine checking of intrauterine contraceptive device: Secondary | ICD-10-CM

## 2018-09-16 DIAGNOSIS — Z124 Encounter for screening for malignant neoplasm of cervix: Secondary | ICD-10-CM

## 2018-09-16 NOTE — Progress Notes (Signed)
Patient says she is having some random spotting. Armandina Stammer RN

## 2018-09-16 NOTE — Progress Notes (Signed)
   Subjective:   Patient Name: Brandi Lane, female   DOB: July 02, 1995, 23 y.o.  MRN: [id number]  HPI Patient here for an IUD check.  She had the Liletta IUD placed 1 month ago.  She reports occasional brown spotting.   Review of Systems  Constitutional: Negative for fever and chills.  Gastrointestinal: Negative for abdominal pain.  Genitourinary: Negative for vaginal discharge, vaginal pain, pelvic pain and dyspareunia.        Objective:   Physical Exam  Constitutional: She appears well-developed and well-nourished.  HENT:  Head: Normocephalic and atraumatic.  Abdominal: Soft. There is no tenderness. There is no guarding.  Genitourinary: There is no rash, tenderness or lesion on the right labia. There is no rash, tenderness or lesion on the left labia. No erythema or tenderness in the vagina. No foreign body around the vagina. No signs of injury around the vagina. No vaginal discharge found.    Skin: Skin is warm and dry.  Psychiatric: She has a normal mood and affect. Her behavior is normal. Judgment and thought content normal.       Assessment & Plan:  1. IUD check up IUD in place.  Pt to call with any other problems.  Recheck in 1 year.

## 2018-09-21 LAB — CYTOLOGY - PAP

## 2018-09-23 ENCOUNTER — Encounter: Payer: Self-pay | Admitting: Family Medicine

## 2018-09-23 DIAGNOSIS — R87612 Low grade squamous intraepithelial lesion on cytologic smear of cervix (LGSIL): Secondary | ICD-10-CM | POA: Insufficient documentation

## 2019-02-16 ENCOUNTER — Encounter: Payer: Self-pay | Admitting: Obstetrics & Gynecology

## 2019-02-16 ENCOUNTER — Other Ambulatory Visit: Payer: Self-pay

## 2019-02-16 ENCOUNTER — Ambulatory Visit (INDEPENDENT_AMBULATORY_CARE_PROVIDER_SITE_OTHER): Payer: BC Managed Care – PPO

## 2019-02-16 VITALS — BP 98/54 | HR 84 | Ht 67.0 in | Wt 141.0 lb

## 2019-02-16 DIAGNOSIS — Z113 Encounter for screening for infections with a predominantly sexual mode of transmission: Secondary | ICD-10-CM | POA: Diagnosis not present

## 2019-02-16 DIAGNOSIS — N898 Other specified noninflammatory disorders of vagina: Secondary | ICD-10-CM

## 2019-02-16 NOTE — Progress Notes (Signed)
Pt states that she has been having mucus like discharge x 3 weeks. Pt denies itching and odor. Wet prep was sent to the lab. Dealie Koelzer l Olayinka Gathers, CMA   Attestation of Attending Supervision of RN: Evaluation and management procedures were performed by the nurse under my supervision and collaboration.  I have reviewed the nursing note and chart, and I agree with the management and plan.  Carolyn L. Harraway-Smith, M.D., Cherlynn June

## 2019-02-18 LAB — CERVICOVAGINAL ANCILLARY ONLY
Bacterial Vaginitis (gardnerella): NEGATIVE
Candida Glabrata: NEGATIVE
Candida Vaginitis: NEGATIVE
Comment: NEGATIVE
Comment: NEGATIVE
Comment: NEGATIVE

## 2019-10-27 ENCOUNTER — Other Ambulatory Visit: Payer: Self-pay

## 2019-10-27 ENCOUNTER — Emergency Department (HOSPITAL_BASED_OUTPATIENT_CLINIC_OR_DEPARTMENT_OTHER)
Admission: EM | Admit: 2019-10-27 | Discharge: 2019-10-27 | Disposition: A | Discharge status: Home / Self Care | Payer: No Typology Code available for payment source | Attending: Emergency Medicine | Admitting: Emergency Medicine

## 2019-10-27 ENCOUNTER — Emergency Department (HOSPITAL_BASED_OUTPATIENT_CLINIC_OR_DEPARTMENT_OTHER): Payer: No Typology Code available for payment source

## 2019-10-27 ENCOUNTER — Encounter (HOSPITAL_BASED_OUTPATIENT_CLINIC_OR_DEPARTMENT_OTHER): Payer: Self-pay

## 2019-10-27 DIAGNOSIS — S6991XA Unspecified injury of right wrist, hand and finger(s), initial encounter: Secondary | ICD-10-CM | POA: Diagnosis present

## 2019-10-27 DIAGNOSIS — S52501A Unspecified fracture of the lower end of right radius, initial encounter for closed fracture: Secondary | ICD-10-CM | POA: Insufficient documentation

## 2019-10-27 DIAGNOSIS — Y999 Unspecified external cause status: Secondary | ICD-10-CM | POA: Insufficient documentation

## 2019-10-27 DIAGNOSIS — Y9289 Other specified places as the place of occurrence of the external cause: Secondary | ICD-10-CM | POA: Diagnosis not present

## 2019-10-27 DIAGNOSIS — Z87891 Personal history of nicotine dependence: Secondary | ICD-10-CM | POA: Diagnosis not present

## 2019-10-27 DIAGNOSIS — Y9389 Activity, other specified: Secondary | ICD-10-CM | POA: Insufficient documentation

## 2019-10-27 DIAGNOSIS — W010XXA Fall on same level from slipping, tripping and stumbling without subsequent striking against object, initial encounter: Secondary | ICD-10-CM | POA: Diagnosis not present

## 2019-10-27 DIAGNOSIS — S62101A Fracture of unspecified carpal bone, right wrist, initial encounter for closed fracture: Secondary | ICD-10-CM

## 2019-10-27 DIAGNOSIS — Y990 Civilian activity done for income or pay: Secondary | ICD-10-CM | POA: Diagnosis not present

## 2019-10-27 MED ORDER — LIDOCAINE HCL 2 % IJ SOLN
INTRAMUSCULAR | Status: AC
  Filled 2019-10-27: qty 20

## 2019-10-27 MED ORDER — HYDROCODONE-ACETAMINOPHEN 5-325 MG PO TABS: 1.0000 | ORAL_TABLET | Freq: Four times a day (QID) | ORAL | 0 refills | Status: DC | PRN

## 2019-10-27 MED ORDER — LIDOCAINE HCL 2 % IJ SOLN
10.0000 mL | Freq: Once | INTRAMUSCULAR | Status: AC
  Administered 2019-10-27: 200 mg
  Filled 2019-10-27: qty 20

## 2019-10-27 MED FILL — HYDROCODON-APAP 5-325: 5-325 | 3 days supply | Qty: 12 | Fill #0

## 2019-10-27 NOTE — ED Provider Notes (Signed)
[facility] EMERGENCY DEPARTMENT Provider Note  CSN: [id number] Arrival date & time: 10/27/19 1311    History Chief Complaint  Patient presents with  . Wrist Injury    HPI  Brandi Lane is a 24 y.o. female reports she slipped and fell at work just prior to arrival injuring her R wrist. She is right handed. She is complaining of severe aching pain, worse with movement. No other injuries.    Past Medical History:  Diagnosis Date  . Anemia   . History of blood transfusion 2013  . Spherocytosis [facility])     Past Surgical History:  Procedure Laterality Date  . WISDOM TOOTH EXTRACTION      No family history on file.  Social History   Tobacco Use  . Smoking status: Former Smoker    Quit date: 12/11/2013    Years since quitting: 5.8  . Smokeless tobacco: Never Used  Vaping Use  . Vaping Use: Never used  Substance Use Topics  . Alcohol use: Yes    Comment: occasional   . Drug use: No     Home Medications Prior to Admission medications   Medication Sig Start Date End Date Taking? Authorizing Provider  folic acid (FOLVITE) 1 MG tablet Take 1 mg by mouth daily.   Yes [provider]  sertraline (ZOLOFT) 25 MG tablet Take 1 tablet (25 mg total) by mouth daily. 08/05/18 10/27/19 Yes Levie Heritage, DO  HYDROcodone-acetaminophen (NORCO/VICODIN) 5-325 MG tablet Take 1 tablet by mouth every 6 (six) hours as needed for severe pain. 10/27/19   Pollyann Savoy, MD     Allergies    Sulfasalazine and Sulfa antibiotics   Review of Systems   Review of Systems A comprehensive review of systems was completed and negative except as noted in HPI.    Physical Exam BP 109/75 (BP Location: Left Arm)   Pulse 68   Temp 98.5 F (36.9 C) (Oral)   Resp 18   Ht 5\' 7"  (1.702 m)   Wt 65.3 kg   SpO2 100%   BMI 22.55 kg/m   Physical Exam Vitals and nursing note reviewed.  Constitutional:      Appearance: Normal appearance.  HENT:     Head:  Normocephalic and atraumatic.     Nose: Nose normal.     Mouth/Throat:     Mouth: Mucous membranes are moist.  Eyes:     Extraocular Movements: Extraocular movements intact.     Conjunctiva/sclera: Conjunctivae normal.  Cardiovascular:     Rate and Rhythm: Normal rate.  Pulmonary:     Effort: Pulmonary effort is normal.     Breath sounds: Normal breath sounds.  Abdominal:     General: Abdomen is flat.     Palpations: Abdomen is soft.     Tenderness: There is no abdominal tenderness.  Musculoskeletal:        General: Swelling and tenderness present.     Cervical back: Neck supple.     Comments: Swelling and tenderness to R wrist, ROM decreased due to pain. NVI  Skin:    General: Skin is warm and dry.  Neurological:     General: No focal deficit present.     Mental Status: She is alert.  Psychiatric:        Mood and Affect: Mood normal.      ED Results / Procedures / Treatments   Labs (all labs ordered are listed, but only abnormal results are displayed) Labs Reviewed -  No data to display  EKG None   Radiology DG Wrist Complete Right  Result Date: 10/27/2019 CLINICAL DATA:  Fall at work. EXAM: RIGHT WRIST - COMPLETE 3+ VIEW COMPARISON:  12/29/2010 hand radiographs. FINDINGS: Mildly comminuted distal radius fracture, primarily transverse in orientation. No convincing evidence of intra-articular extension. Adjacent ulna intact. Normal scaphoid. IMPRESSION: Distal radius fracture. Electronically Signed   By: Jeronimo Greaves M.D.   On: 10/27/2019 13:59    Procedures .Ortho Injury Treatment  Date/Time: 10/27/2019 2:44 PM Performed by: Pollyann Savoy, MD Authorized by: Pollyann Savoy, MD   Consent:    Consent obtained:  Verbal   Consent given by:  PatientInjury location: wrist Location details: right wrist Injury type: fracture Fracture type: distal radius Pre-procedure neurovascular assessment: neurovascularly intact Pre-procedure distal perfusion:  normal Pre-procedure neurological function: normal Pre-procedure range of motion: reduced Anesthesia: hematoma block  Anesthesia: Local anesthesia used: yes Local Anesthetic: lidocaine 2% without epinephrine Anesthetic total: 5 mL Manipulation performed: no Immobilization: splint Splint type: sugar tong Supplies used: Ortho-Glass Post-procedure neurovascular assessment: post-procedure neurovascularly intact Post-procedure distal perfusion: normal Post-procedure neurological function: normal Post-procedure range of motion: unchanged     Medications Ordered in the ED Medications  lidocaine (XYLOCAINE) 2 % (with pres) injection 200 mg (200 mg Infiltration Given 10/27/19 1404)     MDM Rules/Calculators/A&P MDM Xrays reviewed. Will plan hematoma block and sugar tong splint. Patient has seen [facility] before. Will be referred there for followup.  ED Course  I have reviewed the triage vital signs and the nursing notes.  Pertinent labs & imaging results that were available during my care of the patient were reviewed by me and considered in my medical decision making (see chart for details).     Final Clinical Impression(s) / ED Diagnoses Final diagnoses:  Closed fracture of right wrist, initial encounter    Rx / DC Orders ED Discharge Orders         Ordered    HYDROcodone-acetaminophen (NORCO/VICODIN) 5-325 MG tablet  Every 6 hours PRN     Discontinue  Reprint     10/27/19 1443           Pollyann Savoy, MD 10/27/19 1444

## 2019-10-27 NOTE — ED Triage Notes (Signed)
Pt states she fell at work ~30 min PTA-pain to right wrist-deformity noted-NAD-steady gait

## 2020-05-03 ENCOUNTER — Other Ambulatory Visit (HOSPITAL_COMMUNITY)
Admission: RE | Admit: 2020-05-03 | Discharge: 2020-05-03 | Disposition: A | Discharge status: Home / Self Care | Payer: BC Managed Care – PPO | Source: Ambulatory Visit | Attending: Family Medicine | Admitting: Family Medicine

## 2020-05-03 ENCOUNTER — Other Ambulatory Visit: Payer: Self-pay

## 2020-05-03 ENCOUNTER — Encounter: Payer: Self-pay | Admitting: Family Medicine

## 2020-05-03 ENCOUNTER — Ambulatory Visit (INDEPENDENT_AMBULATORY_CARE_PROVIDER_SITE_OTHER): Payer: BC Managed Care – PPO | Admitting: Family Medicine

## 2020-05-03 VITALS — BP 112/58 | HR 71 | Wt 148.0 lb

## 2020-05-03 DIAGNOSIS — Z01419 Encounter for gynecological examination (general) (routine) without abnormal findings: Secondary | ICD-10-CM

## 2020-05-03 NOTE — Progress Notes (Signed)
GYNECOLOGY ANNUAL PREVENTATIVE CARE ENCOUNTER NOTE  Subjective:   Brandi Lane is a 25 y.o. G49P1001 female here for a routine annual gynecologic exam.  Current complaints: none.   Denies abnormal vaginal bleeding, discharge, pelvic pain, problems with intercourse or other gynecologic concerns.    Gynecologic History No LMP recorded. (Menstrual status: IUD). Patient is sexually active  Contraception: IUD Last Pap: 2020. Results were: abnormal - LSIL Last mammogram: n/a.  Obstetric History OB History  Gravida Para Term Preterm AB Living  1 1 1     1   SAB IAB Ectopic Multiple Live Births        0 1    # Outcome Date GA Lbr Len/2nd Weight Sex Delivery Anes PTL Lv  1 Term 07/07/18 [redacted]w[redacted]d 03:00 / 03:18 7 lb 7.9 oz (3.4 kg) F Vag-Vacuum EPI  LIV    Past Medical History:  Diagnosis Date  . Anemia   . History of blood transfusion 2013  . Spherocytosis [facility])     Past Surgical History:  Procedure Laterality Date  . WISDOM TOOTH EXTRACTION      Current Outpatient Medications on File Prior to Visit  Medication Sig Dispense Refill  . folic acid (FOLVITE) 1 MG tablet Take 1 mg by mouth daily.    [facility] HYDROcodone-acetaminophen (NORCO/VICODIN) 5-325 MG tablet Take 1 tablet by mouth every 6 (six) hours as needed for severe pain. (Patient not taking: Reported on 05/03/2020) 12 tablet 0  . sertraline (ZOLOFT) 25 MG tablet Take 1 tablet (25 mg total) by mouth daily. 90 tablet 3   No current facility-administered medications on file prior to visit.    Allergies  Allergen Reactions  . Sulfasalazine Other (See Comments)    Family is allergic, pt has never had it but was told to never take it  . Sulfa Antibiotics     Mother and grandmother are allergic     Social History   Socioeconomic History  . Marital status: Married    Spouse name: Not on file  . Number of children: Not on file  . Years of education: Not on file  . Highest education level: Not on file  Occupational  History  . Occupation: 05/05/2020  Tobacco Use  . Smoking status: Former Smoker    Quit date: 12/11/2013    Years since quitting: 6.3  . Smokeless tobacco: Never Used  Vaping Use  . Vaping Use: Never used  Substance and Sexual Activity  . Alcohol use: Yes    Comment: occasional   . Drug use: No  . Sexual activity: Not on file  Other Topics Concern  . Not on file  Social History Narrative  . Not on file   Social Determinants of Health   Financial Resource Strain: Not on file  Food Insecurity: Not on file  Transportation Needs: Not on file  Physical Activity: Not on file  Stress: Not on file  Social Connections: Not on file  Intimate Partner Violence: Not on file    No family history on file.  The following portions of the patient's history were reviewed and updated as appropriate: allergies, current medications, past family history, past medical history, past social history, past surgical history and problem list.  Review of Systems Pertinent items are noted in HPI.   Objective:  BP (!) 112/58   Pulse 71   Wt 148 lb (67.1 kg)   BMI 23.18 kg/m  Wt Readings from Last 3 Encounters:  05/03/20 148 lb (67.1  kg)  10/27/19 144 lb (65.3 kg)  02/16/19 141 lb (64 kg)     Chaperone present during exam  CONSTITUTIONAL: Well-developed, well-nourished female in no acute distress.  HENT:  Normocephalic, atraumatic, External right and left ear normal. Oropharynx is clear and moist EYES: Conjunctivae and EOM are normal. Pupils are equal, round, and reactive to light. No scleral icterus.  NECK: Normal range of motion, supple, no masses.  Normal thyroid.   CARDIOVASCULAR: Normal heart rate noted, regular rhythm RESPIRATORY: Clear to auscultation bilaterally. Effort and breath sounds normal, no problems with respiration noted. BREASTS: Symmetric in size. No masses, skin changes, nipple drainage, or lymphadenopathy. ABDOMEN: Soft, normal bowel sounds, no distention noted.   No tenderness, rebound or guarding.  PELVIC: Normal appearing external genitalia; normal appearing vaginal mucosa and cervix.  No abnormal discharge noted.  Normal uterine size, no other palpable masses, no uterine or adnexal tenderness. MUSCULOSKELETAL: Normal range of motion. No tenderness.  No cyanosis, clubbing, or edema.  2+ distal pulses. SKIN: Skin is warm and dry. No rash noted. Not diaphoretic. No erythema. No pallor. NEUROLOGIC: Alert and oriented to person, place, and time. Normal reflexes, muscle tone coordination. No cranial nerve deficit noted. PSYCHIATRIC: Normal mood and affect. Normal behavior. Normal judgment and thought content.  Assessment:  Annual gynecologic examination with pap smear   Plan:  1. Well Woman Exam Will follow up results of pap smear and manage accordingly. Mammogram scheduled STD testing discussed. Patient requested testing - GC/CT - Cytology - PAP( [place])  2. LSIL Recheck PAP. Colposcopy with abnormal result.  Routine preventative health maintenance measures emphasized. Please refer to After Visit Summary for other counseling recommendations.    Candelaria Celeste, DO [facility]

## 2020-05-04 LAB — CYTOLOGY - PAP
Chlamydia: NEGATIVE
Comment: NEGATIVE
Comment: NORMAL
Diagnosis: NEGATIVE
Neisseria Gonorrhea: NEGATIVE

## 2020-09-01 IMAGING — US US MFM OB COMP +14 WKS
1 series · 13 of 28 positions shown · non-contrast
Comparison: none

[Series 1: us mfm ob comp +14 wks · 78 acquisitions, 13 frames shown]
[im 3/78]
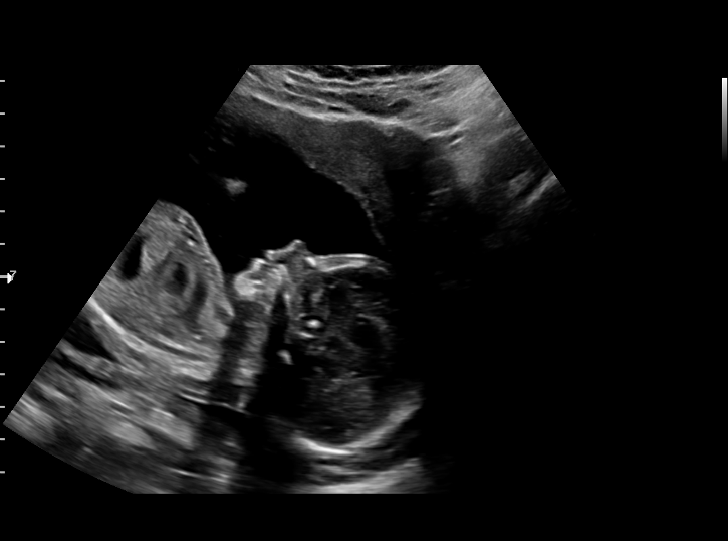
[im 9/78]
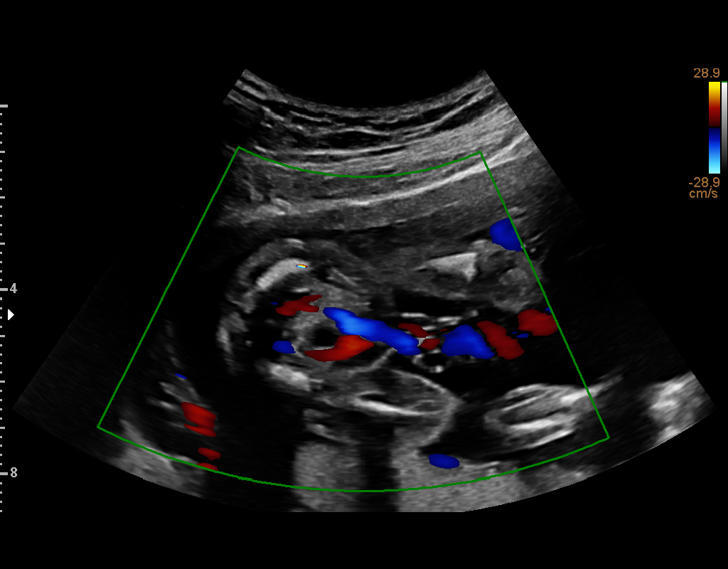
[im 15/78]
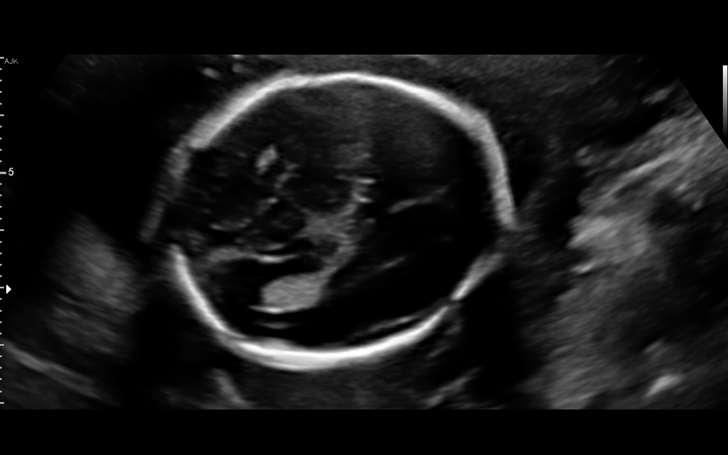
[im 20/78]
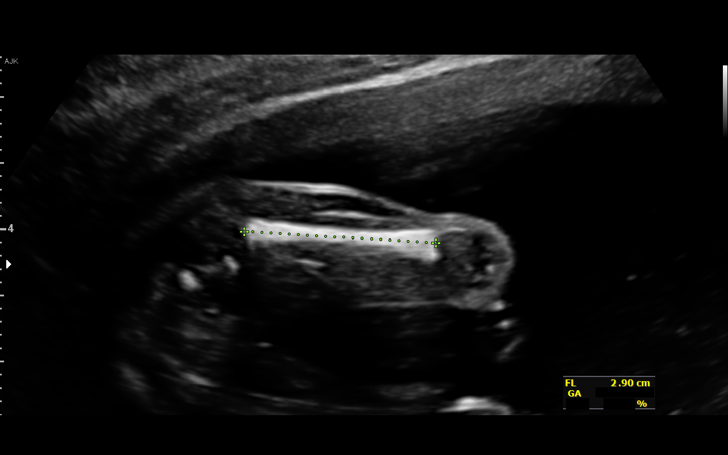
[im 26/78]
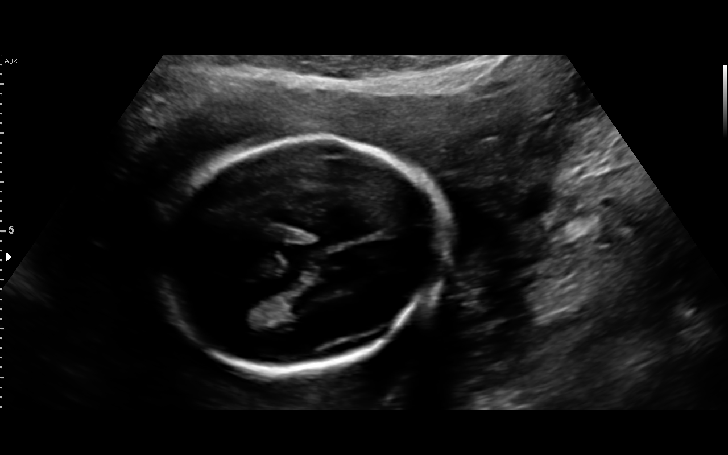
[im 32/78]
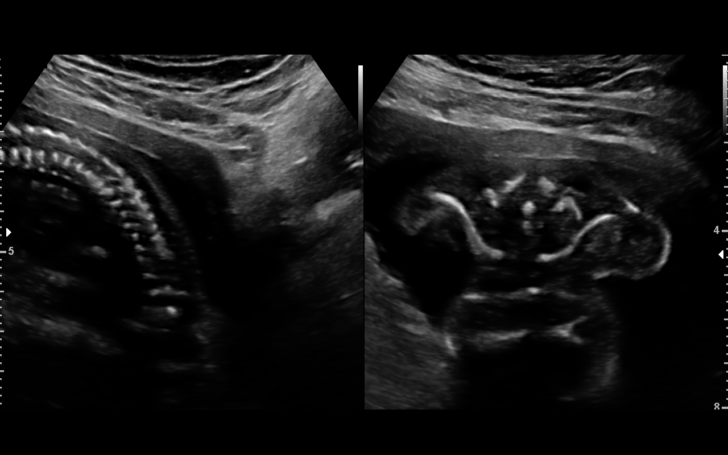
[im 40/78]
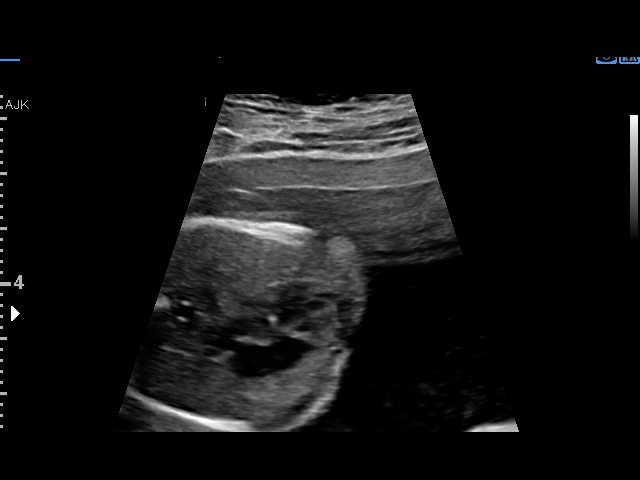
[im 46/78]
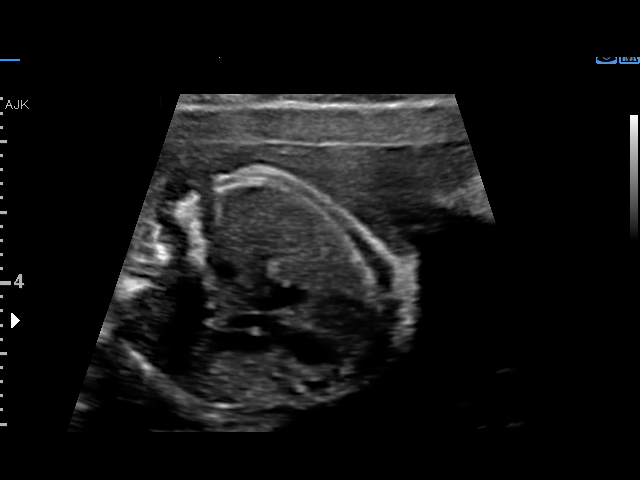
[im 52/78]
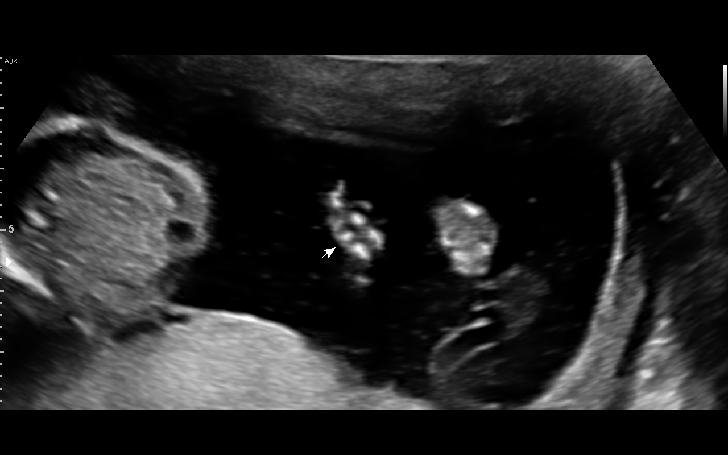
[im 58/78]
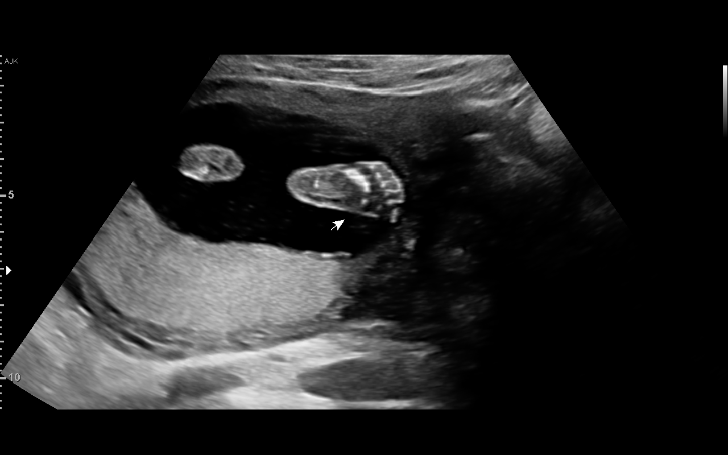
[im 63/78]
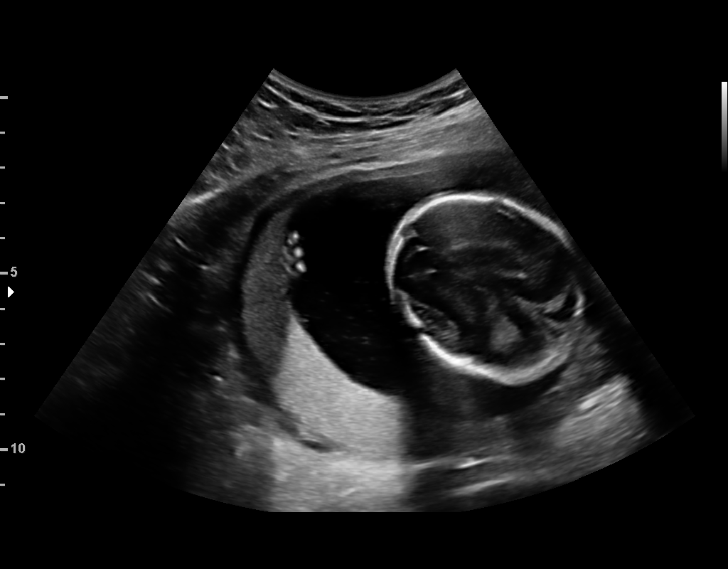
[im 69/78]
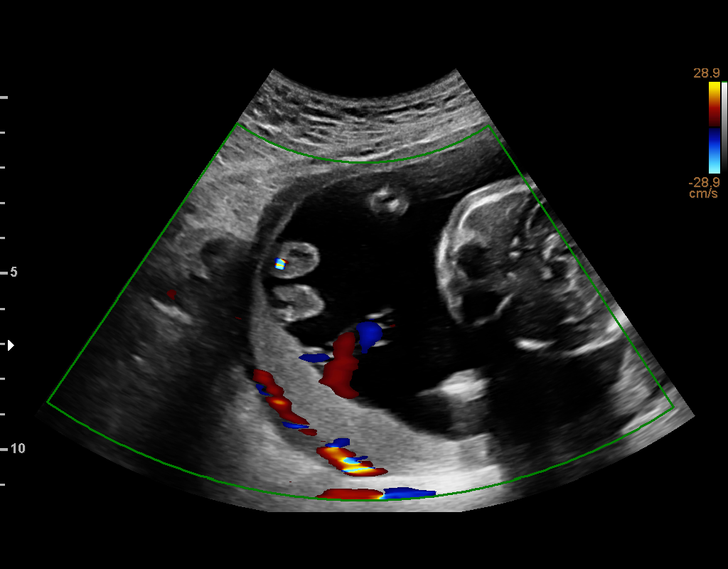
[im 75/78]
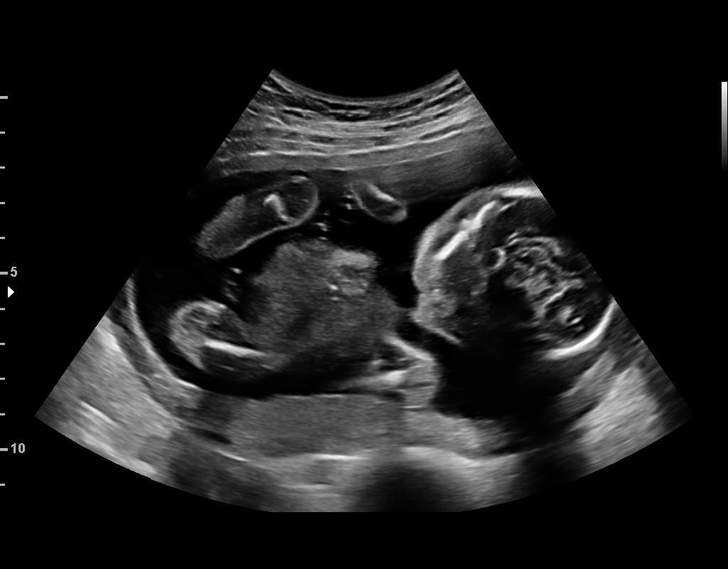

[13 of 28 positions shown; findings below may reference images not displayed]

----------------------------------------------------------------------

 ----------------------------------------------------------------------
Indications

  Encounter for antenatal screening for
  malformations
  Rh negative state in antepartum
  19 weeks gestation of pregnancy
 ----------------------------------------------------------------------
Vital Signs

 BMI:
Fetal Evaluation

 Num Of Fetuses:         1
 Fetal Heart Rate(bpm):  145
 Cardiac Activity:       Observed
 Presentation:           Cephalic
 Placenta:               Posterior
 P. Cord Insertion:      Visualized

 Amniotic Fluid
 AFI FV:      Within normal limits

                             Largest Pocket(cm)

Biometry

 BPD:      47.1  mm     G. Age:  20w 2d         72  %    CI:        73.25   %    70 - 86
                                                         FL/HC:      16.8   %    16.8 -
 HC:      174.9  mm     G. Age:  20w 0d         56  %    HC/AC:      1.07        1.09 -
 AC:      162.8  mm     G. Age:  21w 3d         90  %    FL/BPD:     62.4   %
 FL:       29.4  mm     G. Age:  19w 0d         21  %    FL/AC:      18.1   %    20 - 24
 CER:      21.3  mm     G. Age:  20w 2d         60  %
 NFT:       4.3  mm

 LV:        4.8  mm
 CM:        4.9  mm

 Est. FW:     343  gm    0 lb 12 oz      56  %
OB History

 Gravidity:    1
Gestational Age

 LMP:           19w 5d        Date:  10/16/17                 EDD:   07/23/18
 U/S Today:     20w 1d                                        EDD:   07/20/18
 Best:          19w 5d     Det. By:  LMP  (10/16/17)          EDD:   07/23/18
Anatomy

 Cranium:               Appears normal         Aortic Arch:            Appears normal
 Cavum:                 Appears normal         Ductal Arch:            Appears normal
 Ventricles:            Appears normal         Diaphragm:              Appears normal
 Choroid Plexus:        Appears normal         Stomach:                Appears normal, left
                                                                       sided
 Cerebellum:            Appears normal         Abdomen:                Appears normal
 Posterior Fossa:       Appears normal         Abdominal Wall:         Appears nml (cord
                                                                       insert, abd wall)
 Nuchal Fold:           Appears normal         Cord Vessels:           Appears normal (3
                                                                       vessel cord)
 Face:                  Appears normal         Kidneys:                Appear normal
                        (orbits and profile)
 Lips:                  Appears normal         Bladder:                Appears normal
 Thoracic:              Appears normal         Spine:                  Appears normal
 Heart:                 Appears normal         Upper Extremities:      Appears normal
                        (4CH, axis, and
                        situs)
 RVOT:                  Appears normal         Lower Extremities:      Appears normal
 LVOT:                  Appears normal

 Other:  Parents do not wish to know sex of fetus. Fetus appears to be female.
         Heels and 5th digit visualized.
Cervix Uterus Adnexa

 Cervix
 Length:           3.94  cm.
 Normal appearance by transabdominal scan.

 Uterus
 No abnormality visualized.

 Left Ovary
 Not visualized.

 Right Ovary
 Not visualized.

 Adnexa
 No abnormality visualized.
Impression

 Normal interval growth.  No ultrasonic evidence of structural
 fetal anomalies.
Recommendations

 Follow up as clinically indicated.

## 2021-05-08 ENCOUNTER — Other Ambulatory Visit: Payer: Self-pay

## 2021-05-08 ENCOUNTER — Encounter: Payer: Self-pay | Admitting: Family Medicine

## 2021-05-08 ENCOUNTER — Ambulatory Visit (INDEPENDENT_AMBULATORY_CARE_PROVIDER_SITE_OTHER): Payer: BC Managed Care – PPO | Admitting: Family Medicine

## 2021-05-08 ENCOUNTER — Other Ambulatory Visit (HOSPITAL_COMMUNITY)
Admission: RE | Admit: 2021-05-08 | Discharge: 2021-05-08 | Disposition: A | Discharge status: Home / Self Care | Payer: BC Managed Care – PPO | Source: Ambulatory Visit | Attending: Family Medicine | Admitting: Family Medicine

## 2021-05-08 VITALS — BP 112/63 | HR 63 | Ht 67.0 in | Wt 140.0 lb

## 2021-05-08 DIAGNOSIS — R87612 Low grade squamous intraepithelial lesion on cytologic smear of cervix (LGSIL): Secondary | ICD-10-CM | POA: Diagnosis not present

## 2021-05-08 DIAGNOSIS — Z01419 Encounter for gynecological examination (general) (routine) without abnormal findings: Secondary | ICD-10-CM

## 2021-05-08 DIAGNOSIS — Z30431 Encounter for routine checking of intrauterine contraceptive device: Secondary | ICD-10-CM | POA: Diagnosis not present

## 2021-05-08 NOTE — Progress Notes (Signed)
Last pap- 05/03/20- negative Pt has occasional cramping with IUD

## 2021-05-08 NOTE — Progress Notes (Signed)
GYNECOLOGY ANNUAL PREVENTATIVE CARE ENCOUNTER NOTE  Subjective:   Brandi Lane is a 26 y.o. G53P1001 female here for a routine annual gynecologic exam.  Current complaints: occasional cramping pain. Doesn't always have a period. She thinks she gets the pain when she would have a period. Pain lasts for a day or two. Currently going through a divorce. Denies abnormal vaginal bleeding, discharge, pelvic pain, problems with intercourse or other gynecologic concerns.    Gynecologic History No LMP recorded. (Menstrual status: IUD). Patient is not sexually active  Contraception: IUD Last Pap: 2021. Results were: normal Last mammogram: n/a.   Obstetric History OB History  Gravida Para Term Preterm AB Living  1 1 1     1   SAB IAB Ectopic Multiple Live Births        0 1    # Outcome Date GA Lbr Len/2nd Weight Sex Delivery Anes PTL Lv  1 Term 07/07/18 [redacted]w[redacted]d 03:00 / 03:18 7 lb 7.9 oz (3.4 kg) F Vag-Vacuum EPI  LIV    Past Medical History:  Diagnosis Date   Anemia    History of blood transfusion 2013   Spherocytosis [facility])     Past Surgical History:  Procedure Laterality Date   WISDOM TOOTH EXTRACTION      Current Outpatient Medications on File Prior to Visit  Medication Sig Dispense Refill   folic acid (FOLVITE) 1 MG tablet Take 1 mg by mouth daily.     levonorgestrel (LILETTA, 52 MG,) 20.1 MCG/DAY IUD 1 each by Intrauterine route once.     HYDROcodone-acetaminophen (NORCO/VICODIN) 5-325 MG tablet Take 1 tablet by mouth every 6 (six) hours as needed for severe pain. (Patient not taking: Reported on 05/03/2020) 12 tablet 0   sertraline (ZOLOFT) 25 MG tablet Take 1 tablet (25 mg total) by mouth daily. 90 tablet 3   No current facility-administered medications on file prior to visit.    Allergies  Allergen Reactions   Sulfa Antibiotics Other (See Comments)    Mother and grandmother are allergic    Sulfasalazine Other (See Comments)    Family is allergic, pt has never  had it but was told to never take it    Social History   Socioeconomic History   Marital status: Married    Spouse name: Not on file   Number of children: Not on file   Years of education: Not on file   Highest education level: Not on file  Occupational History   Occupation: 05/05/2020  Tobacco Use   Smoking status: Former    Types: Cigarettes    Quit date: 12/11/2013    Years since quitting: 7.4   Smokeless tobacco: Never  Vaping Use   Vaping Use: Never used  Substance and Sexual Activity   Alcohol use: Yes    Comment: occasional    Drug use: No   Sexual activity: Yes    Birth control/protection: I.U.D.  Other Topics Concern   Not on file  Social History Narrative   Not on file   Social Determinants of Health   Financial Resource Strain: Not on file  Food Insecurity: Not on file  Transportation Needs: Not on file  Physical Activity: Not on file  Stress: Not on file  Social Connections: Not on file  Intimate Partner Violence: Not on file    History reviewed. No pertinent family history.  The following portions of the patient's history were reviewed and updated as appropriate: allergies, current medications, past family history, past  medical history, past social history, past surgical history and problem list.  Review of Systems Pertinent items are noted in HPI.   Objective:  BP 112/63    Pulse 63    Ht 5\' 7"  (1.702 m)    Wt 140 lb (63.5 kg)    Breastfeeding No    BMI 21.93 kg/m  Wt Readings from Last 3 Encounters:  05/08/21 140 lb (63.5 kg)  05/03/20 148 lb (67.1 kg)  10/27/19 144 lb (65.3 kg)     Chaperone present during exam  CONSTITUTIONAL: Well-developed, well-nourished female in no acute distress.  HENT:  Normocephalic, atraumatic, External right and left ear normal. Oropharynx is clear and moist EYES: Conjunctivae and EOM are normal. Pupils are equal, round, and reactive to light. No scleral icterus.  NECK: Normal range of motion, supple,  no masses.  Normal thyroid.   CARDIOVASCULAR: Normal heart rate noted, regular rhythm RESPIRATORY: Clear to auscultation bilaterally. Effort and breath sounds normal, no problems with respiration noted. BREASTS: Symmetric in size. No masses, skin changes, nipple drainage, or lymphadenopathy. ABDOMEN: Soft, normal bowel sounds, no distention noted.  No tenderness, rebound or guarding.  PELVIC: Normal appearing external genitalia; normal appearing vaginal mucosa and cervix.  No abnormal discharge noted. IUD strings seen at cervix. MUSCULOSKELETAL: Normal range of motion. No tenderness.  No cyanosis, clubbing, or edema.  2+ distal pulses. SKIN: Skin is warm and dry. No rash noted. Not diaphoretic. No erythema. No pallor. NEUROLOGIC: Alert and oriented to person, place, and time. Normal reflexes, muscle tone coordination. No cranial nerve deficit noted. PSYCHIATRIC: Normal mood and affect. Normal behavior. Normal judgment and thought content.  Assessment:  Annual gynecologic examination with pap smear   Plan:  1. Well Woman Exam Will follow up results of pap smear and manage accordingly. STD testing discussed. Patient declined testing  2. LGSIL on Pap smear of cervix PAP this year, then routine if normal.  3. IUD check up IUD present. Cramping probably due to when she would normally have a period. Patient ok with this and not wanting to do anything else at this time. Ibuprofen, tylenol during that period of time. Return if worsens.    Routine preventative health maintenance measures emphasized. Please refer to After Visit Summary for other counseling recommendations.    10/29/19, DO [facility]

## 2021-05-09 LAB — CYTOLOGY - PAP: Diagnosis: NEGATIVE

## 2021-11-12 ENCOUNTER — Telehealth: Payer: BC Managed Care – PPO | Admitting: Physician Assistant

## 2021-11-12 DIAGNOSIS — G44209 Tension-type headache, unspecified, not intractable: Secondary | ICD-10-CM | POA: Diagnosis not present

## 2021-11-12 MED ORDER — TIZANIDINE HCL 4 MG PO TABS: 4.0000 mg | ORAL_TABLET | Freq: Four times a day (QID) | ORAL | 0 refills | Status: DC | PRN

## 2021-11-12 NOTE — Patient Instructions (Signed)
  Brandi Lane, thank you for joining Piedad Climes, PA-C for today's virtual visit.  While this provider is not your primary care provider (PCP), if your PCP is located in our provider database this encounter information will be shared with them immediately following your visit.  Consent: (Patient) Brandi Lane provided verbal consent for this virtual visit at the beginning of the encounter.  Current Medications:  Current Outpatient Medications:    tiZANidine (ZANAFLEX) 4 MG tablet, Take 1 tablet (4 mg total) by mouth every 6 (six) hours as needed for muscle spasms., Disp: 15 tablet, Rfl: 0   folic acid (FOLVITE) 1 MG tablet, Take 1 mg by mouth daily., Disp: , Rfl:    levonorgestrel (LILETTA, 52 MG,) 20.1 MCG/DAY IUD, 1 each by Intrauterine route once., Disp: , Rfl:    Medications ordered in this encounter:  Meds ordered this encounter  Medications   tiZANidine (ZANAFLEX) 4 MG tablet    Sig: Take 1 tablet (4 mg total) by mouth every 6 (six) hours as needed for muscle spasms.    Dispense:  15 tablet    Refill:  0    Order Specific Question:   Supervising Provider    Answer:   Hyacinth Meeker, BRIAN [[id number]]     *If you need refills on other medications prior to your next appointment, please contact your pharmacy*  Follow-Up: Call back or seek an in-person evaluation if the symptoms worsen or if the condition fails to improve as anticipated.  Other Instructions Please avoid any heavy lifting, pushing or pulling. Make sure your pillow is supporting your neck while sleeping at night. You can apply heating pad to the area for 10 minutes, a few times per day.  Ok to alternate Tylenol and Ibuprofen OTC for headache. Use the Zanaflex once home as directed. No driving after taking this medication.      If you have been instructed to have an in-person evaluation today at a local Urgent Care facility, please use the link below. It will take you to a list of all of our  available [facility], including address, phone number and hours of operation. Please do not delay care.  [facility]  If you or a family member do not have a primary care provider, use the link below to schedule a visit and establish care. When you choose a [facility] primary care physician or advanced practice provider, you gain a long-term partner in health. Find a Primary Care Provider  Learn more about [facility]'s in-office and virtual care options: [facility] - Get Care Now

## 2021-11-12 NOTE — Progress Notes (Signed)
Virtual Visit Consent   Brandi Lane, you are scheduled for a virtual visit with a [facility] provider today. Just as with appointments in the office, your consent must be obtained to participate. Your consent will be active for this visit and any virtual visit you may have with one of our providers in the next 365 days. If you have a MyChart account, a copy of this consent can be sent to you electronically.  As this is a virtual visit, video technology does not allow for your provider to perform a traditional examination. This may limit your provider's ability to fully assess your condition. If your provider identifies any concerns that need to be evaluated in person or the need to arrange testing (such as labs, EKG, etc.), we will make arrangements to do so. Although advances in technology are sophisticated, we cannot ensure that it will always work on either your end or our end. If the connection with a video visit is poor, the visit may have to be switched to a telephone visit. With either a video or telephone visit, we are not always able to ensure that we have a secure connection.  By engaging in this virtual visit, you consent to the provision of healthcare and authorize for your insurance to be billed (if applicable) for the services provided during this visit. Depending on your insurance coverage, you may receive a charge related to this service.  I need to obtain your verbal consent now. Are you willing to proceed with your visit today? KAHLANI GRABER has provided verbal consent on 11/12/2021 for a virtual visit (video or telephone). Brandi Lane, [place]  Date: 11/12/2021 10:35 AM  Virtual Visit via Video Note   I, Brandi Lane, connected with  Brandi Lane  ([id number], 01-20-1996) on 11/12/21 at 10:00 AM EDT by a video-enabled telemedicine application and verified that I am speaking with the correct person using two identifiers.  Location: Patient:  Virtual Visit Location Patient: Other: Parked Car outside her work Provider: Pharmacist, community: Home Office   I discussed the limitations of evaluation and management by telemedicine and the availability of in person appointments. The patient expressed understanding and agreed to proceed.    History of Present Illness: Brandi Lane is a 26 y.o. who identifies as a female who was assigned female at birth, and is being seen today for headache over the past day or so. Notes HA is occipital and R sided. Some tenderness to palpitation in the area but denies any noted skin changes or rash. Denies any trauma or injury. Denies ear pain or jaw pain. Some nausea initially without vomiting. Denies any URI symptoms, photophobia or phonophobia. Took Ibuprofen OTC which helped some.   HPI: HPI  Problems:  Patient Active Problem List   Diagnosis Date Noted   LGSIL on Pap smear of cervix 09/23/2018   Deviated nasal septum 11/24/2015   FH: hemochromatosis 01/18/2013   Anemia 01/02/2012   Jaundice 01/02/2012   Hereditary spherocytosis (HCC) 07/14/2011    Allergies:  Allergies  Allergen Reactions   Sulfa Antibiotics Other (See Comments)    Mother and grandmother are allergic    Sulfasalazine Other (See Comments)    Family is allergic, pt has never had it but was told to never take it   Medications:  Current Outpatient Medications:    tiZANidine (ZANAFLEX) 4 MG tablet, Take 1 tablet (4 mg total) by mouth every 6 (six) hours as needed for muscle  spasms., Disp: 15 tablet, Rfl: 0   folic acid (FOLVITE) 1 MG tablet, Take 1 mg by mouth daily., Disp: , Rfl:    levonorgestrel (LILETTA, 52 MG,) 20.1 MCG/DAY IUD, 1 each by Intrauterine route once., Disp: , Rfl:   Observations/Objective: Patient is well-developed, well-nourished in no acute distress.  Resting comfortably in parked car.  Head is normocephalic, atraumatic.  No labored breathing. Speech is clear and coherent with logical  content.  Patient is alert and oriented at baseline.   Assessment and Plan: 1. Tension headache - tiZANidine (ZANAFLEX) 4 MG tablet; Take 1 tablet (4 mg total) by mouth every 6 (six) hours as needed for muscle spasms.  Dispense: 15 tablet; Refill: 0  Start Zanaflex as directed. Continue Ibuprofen, alternating with tylenol. Heating pad to the area and neck for 10 minutes, a few times per day. Follow-up in person if still recurring.   Follow Up Instructions: I discussed the assessment and treatment plan with the patient. The patient was provided an opportunity to ask questions and all were answered. The patient agreed with the plan and demonstrated an understanding of the instructions.  A copy of instructions were sent to the patient via MyChart unless otherwise noted below.   The patient was advised to call back or seek an in-person evaluation if the symptoms worsen or if the condition fails to improve as anticipated.  Time:  I spent 10 minutes with the patient via telehealth technology discussing the above problems/concerns.    Brandi Climes, PA-C

## 2021-11-14 ENCOUNTER — Other Ambulatory Visit: Payer: Self-pay

## 2021-11-14 ENCOUNTER — Encounter (HOSPITAL_BASED_OUTPATIENT_CLINIC_OR_DEPARTMENT_OTHER): Payer: Self-pay | Admitting: Emergency Medicine

## 2021-11-14 ENCOUNTER — Emergency Department (HOSPITAL_BASED_OUTPATIENT_CLINIC_OR_DEPARTMENT_OTHER): Payer: BC Managed Care – PPO

## 2021-11-14 ENCOUNTER — Emergency Department (HOSPITAL_BASED_OUTPATIENT_CLINIC_OR_DEPARTMENT_OTHER)
Admission: EM | Admit: 2021-11-14 | Discharge: 2021-11-14 | Disposition: A | Discharge status: Home / Self Care | Payer: BC Managed Care – PPO | Attending: Physician Assistant | Admitting: Physician Assistant

## 2021-11-14 DIAGNOSIS — R519 Headache, unspecified: Secondary | ICD-10-CM | POA: Insufficient documentation

## 2021-11-14 DIAGNOSIS — R110 Nausea: Secondary | ICD-10-CM | POA: Diagnosis not present

## 2021-11-14 DIAGNOSIS — G44209 Tension-type headache, unspecified, not intractable: Secondary | ICD-10-CM

## 2021-11-14 MED ORDER — PROCHLORPERAZINE EDISYLATE 10 MG/2ML IJ SOLN
10.0000 mg | Freq: Once | INTRAMUSCULAR | Status: AC
  Administered 2021-11-14: 10 mg via INTRAVENOUS
  Filled 2021-11-14: qty 2

## 2021-11-14 MED ORDER — DIPHENHYDRAMINE HCL 50 MG/ML IJ SOLN
25.0000 mg | Freq: Once | INTRAMUSCULAR | Status: AC
  Administered 2021-11-14: 25 mg via INTRAVENOUS
  Filled 2021-11-14: qty 1

## 2021-11-14 NOTE — ED Triage Notes (Signed)
Posterior headache since Saturday. Describes pain as sharp/stabbing. Also reports a knot on posterior scalp since Monday. Knot is tender with palpation. No relief with muscle relaxer, otc meds. Endorses nausea. Denies pain radiation, vomiting, changes in vision, neck pain.

## 2021-11-14 NOTE — ED Provider Notes (Signed)
[facility] EMERGENCY DEPARTMENT Provider Note   CSN: [id number] Arrival date & time: 11/14/21  1430     History  Chief Complaint  Patient presents with   Headache    Brandi Lane is a 26 y.o. female.  HPI 26 year old female with a history of hereditary spherocytosis, anemia presents to the ER with complaints of a headache since Saturday.  Patient reports headache is in the occipital region, she noted a "lump" in the back of her head that is very tender to touch.  She was seen by a virtual urgent care PA 2 days ago and was told that she has a tension headache.  She was given a muscle relaxer (per chart review this appears to be Zanaflex (.  She states this has not relieved her pain and just makes her sleepy.  She has also taken Excedrin and Tylenol with little relief.  She denies any vision changes, fevers, chills, recent tick bites, neck stiffness, no numbness or tingling in her body.  No history of migraines.  She denies any falls, neck trauma, cannot recall sleeping poorly.  Denies any ear pain or jaw pain.  She has nausea when she touches that spot but no vomiting.  Denies any photophobia or phonophobia.  She reports the headache does feel like it is more inside her head rather than sensitivity to the skin.    Home Medications Prior to Admission medications   Medication Sig Start Date End Date Taking? Authorizing Provider  folic acid (FOLVITE) 1 MG tablet Take 1 mg by mouth daily.    [provider]  levonorgestrel (LILETTA, 52 MG,) 20.1 MCG/DAY IUD 1 each by Intrauterine route once.    [provider]  tiZANidine (ZANAFLEX) 4 MG tablet Take 1 tablet (4 mg total) by mouth every 6 (six) hours as needed for muscle spasms. 11/12/21   Waldon Merl, PA-C      Allergies    Sulfa antibiotics and Sulfasalazine    Review of Systems   Review of Systems Ten systems reviewed and are negative for acute change, except as noted in the HPI.   Physical  Exam Updated Vital Signs BP 121/84   Pulse 70   Temp 98.5 F (36.9 C) (Oral)   Resp 17   Ht 5\' 7"  (1.702 m)   Wt 62.6 kg   SpO2 100%   BMI 21.61 kg/m  Physical Exam Vitals and nursing note reviewed.  Constitutional:      General: She is not in acute distress.    Appearance: She is well-developed.  HENT:     Head: Normocephalic and atraumatic.      Comments: Point tenderness to the right occiput.  No fluctuance, warmth.  No rashes.  No midline cervical motion tenderness, mild tenderness along the cervical paraspinal muscles on the right.  Full flexion extension of the neck. Eyes:     Conjunctiva/sclera: Conjunctivae normal.  Cardiovascular:     Rate and Rhythm: Normal rate and regular rhythm.     Heart sounds: No murmur heard. Pulmonary:     Effort: Pulmonary effort is normal. No respiratory distress.     Breath sounds: Normal breath sounds.  Abdominal:     Palpations: Abdomen is soft.     Tenderness: There is no abdominal tenderness.  Musculoskeletal:        General: No swelling.     Cervical back: Neck supple.  Skin:    General: Skin is warm and dry.  Capillary Refill: Capillary refill takes less than 2 seconds.          Comments: Circular rash/ area of excoriation just inferior to the hairline on the back of the neck. No warmth, drainage, fluctuance, bulls-eye appearance, sloughing   Neurological:     Mental Status: She is alert.     Comments: Mental Status:  Alert, thought content appropriate, able to give a coherent history. Speech fluent without evidence of aphasia. Able to follow 2 step commands without difficulty.  Cranial Nerves:  II:  Peripheral visual fields grossly normal, pupils equal, round, reactive to light III,IV, VI: ptosis not present, extra-ocular motions intact bilaterally  V,VII: smile symmetric, facial light touch sensation equal VIII: hearing grossly normal to voice  X: uvula elevates symmetrically  XI: bilateral shoulder shrug symmetric  and strong XII: midline tongue extension without fassiculations Motor:  Normal tone. 5/5 strength of BUE and BLE major muscle groups including strong and equal grip strength and dorsiflexion/plantar flexion Sensory: light touch normal in all extremities. Cerebellar: normal finger-to-nose with bilateral upper extremities, Romberg sign absent Gait: normal gait and balance. Able to walk on toes and heels with ease.     Psychiatric:        Mood and Affect: Mood normal.     ED Results / Procedures / Treatments   Labs (all labs ordered are listed, but only abnormal results are displayed) Labs Reviewed - No data to display  EKG None  Radiology CT Head Wo Contrast  Result Date: 11/14/2021 CLINICAL DATA:  Worsening headache. EXAM: CT HEAD WITHOUT CONTRAST TECHNIQUE: Contiguous axial images were obtained from the base of the skull through the vertex without intravenous contrast. RADIATION DOSE REDUCTION: This exam was performed according to the departmental dose-optimization program which includes automated exposure control, adjustment of the mA and/or kV according to patient size and/or use of iterative reconstruction technique. COMPARISON:  None Available. FINDINGS: Brain: The ventricles are normal in size and configuration. No extra-axial fluid collections are identified. The gray-white differentiation is maintained. No CT findings for acute hemispheric infarction or intracranial hemorrhage. No mass lesions. The brainstem and cerebellum are normal. Vascular: No hyperdense vessels or obvious aneurysm. Skull: No acute skull fracture.  No bone lesion. Sinuses/Orbits: The paranasal sinuses and mastoid air cells are clear. The globes are intact. Other: No scalp lesions, laceration or hematoma. IMPRESSION: Normal head CT. Electronically Signed   By: Marijo Sanes M.D.   On: 11/14/2021 15:53    Procedures Procedures    Medications Ordered in ED Medications  prochlorperazine (COMPAZINE) injection 10  mg (10 mg Intravenous Given 11/14/21 1524)  diphenhydrAMINE (BENADRYL) injection 25 mg (25 mg Intravenous Given 11/14/21 1527)    ED Course/ Medical Decision Making/ A&P                           Medical Decision Making Amount and/or Complexity of Data Reviewed Radiology: ordered.  Risk Prescription drug management.   This patient presents to the ED for concern of headache this involves a number of treatment options, and is a complaint that carries with it a high risk of complications and morbidity.  The differential diagnosis includes migraine headache, tension headache, meningitis, trigeminal neuralgia, GCA, herpes zoster   Co morbidities: Discussed in HPI   Brief History:  26 year old female with a headache since Saturday.  She has a tender spot to the back of her head which is sensitive to touch and induce pain and  nausea.  She denies any associated vomiting, photophobia, vision changes, numbness, weakness, neck stiffness, jaw pain, tooth pain  EMR reviewed including pt PMHx, past surgical history and past visits to ER.   See HPI for more details   Lab Tests:  I ordered and independently interpreted labs.  The pertinent results include:    Not indicated   Imaging Studies:  NAD. I personally reviewed all imaging studies and no acute abnormality found. I agree with radiology interpretation.  CT head w/ no evidence of masses, ICH, stroke, etc   Cardiac Monitoring:  NA NA   Medicines ordered:  I ordered medication including migraine cocktail for headache. Reevaluation of the patient after these medicines showed that the patient  slight improvement I have reviewed the patients home medicines and have made adjustments as needed   Critical Interventions:  NA   Consults:  NA    Reevaluation:  After the interventions noted above I re-evaluated patient and found that they have : slight improvement    Social Determinants of Health:  Lives at home      Problem List / ED Course:  CT of the head negative for masses, acute hemorrhage, stroke.  Patient was given migraine cocktail with mild improvement of headache however patient states this does still persist.  Given the clinical picture, have low suspicion for trigeminal neuralgia, viral meningitis, bacterial meningitis, GCA, herpes zoster.  Headache is most consistent with tension headache especially with point tenderness to the  right occipital area. Headache not consistent  She is already been given a prescription for Zanaflex.  I encouraged massaging and applying a heating pad to the area.  I also encouraged increasing her ibuprofen dose to 800 mg 3 times daily and voltaren gel.  I also recommended following up with physical therapy if this persists.  We discussed strict return precautions.  She voiced understanding and is agreeable.  Dispostion:  After consideration of the diagnostic results and the patients response to treatment, I feel that the patent would benefit from discharge with strict   Final Clinical Impression(s) / ED Diagnoses Final diagnoses:  Tension headache    Rx / DC Orders ED Discharge Orders     None         Mare Ferrari, PA-C 11/14/21 1615    Gwyneth Sprout, MD 11/14/21 1636

## 2021-11-14 NOTE — Discharge Instructions (Addendum)
You were evaluated in the Emergency Department and after careful evaluation, we did not find any emergent condition requiring admission or further testing in the hospital.  Your CT scan was negative for any evidence of masses, strokes, intracranial bleeds.  As discussed, I suspect that your headache is due to a tension headache especially with the point tenderness in your occipital area of your scalp.  I recommend massaging the area with your hands, or a foam roller.  You may also apply heat to the area.  You may take up to 800 mg of ibuprofen every 8 hours. You may also use voltaren gel which is a topical anti-inflammatory which you can apply to the area.   You may also follow-up with physical therapy if your symptoms persist.  Please return to the Emergency Department if you experience any worsening of your condition.  We encourage you to follow up with a primary care provider.  Thank you for allowing [place] to be a part of your care.

## 2022-01-06 ENCOUNTER — Other Ambulatory Visit: Payer: Self-pay

## 2022-01-06 MED ORDER — FLUCONAZOLE 150 MG PO TABS: 150.0000 mg | ORAL_TABLET | Freq: Once | ORAL | 0 refills | Status: AC

## 2022-02-26 ENCOUNTER — Encounter: Payer: Self-pay | Admitting: General Practice

## 2022-04-27 IMAGING — DX DG WRIST COMPLETE 3+V*R*
4 series · 4 of 4 positions shown · non-contrast
Comparison: 12/29/2010 hand radiographs.

CLINICAL DATA: Fall at work.

EXAM:
RIGHT WRIST - COMPLETE 3+ VIEW

[wrist pa]
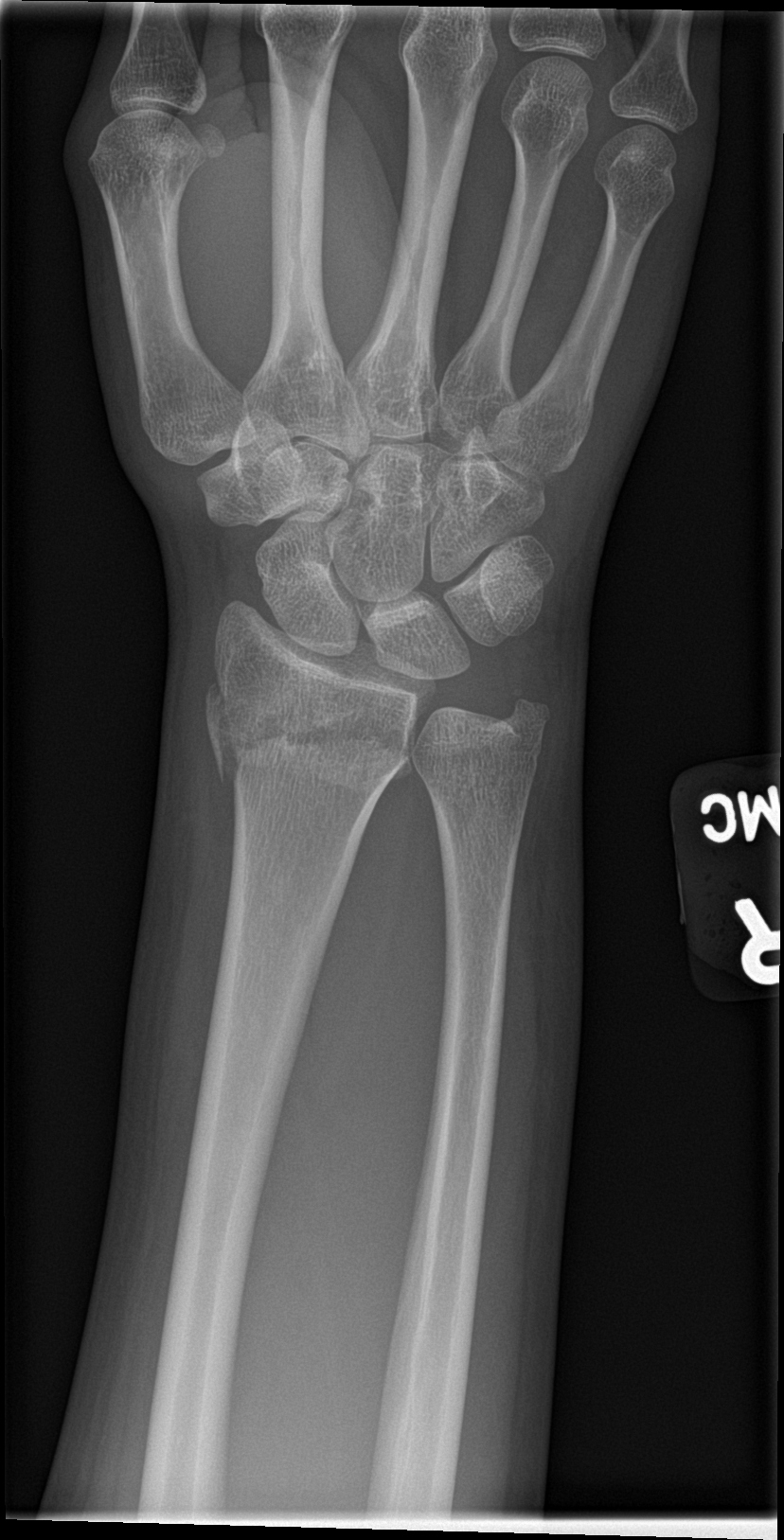

[wrist obl]
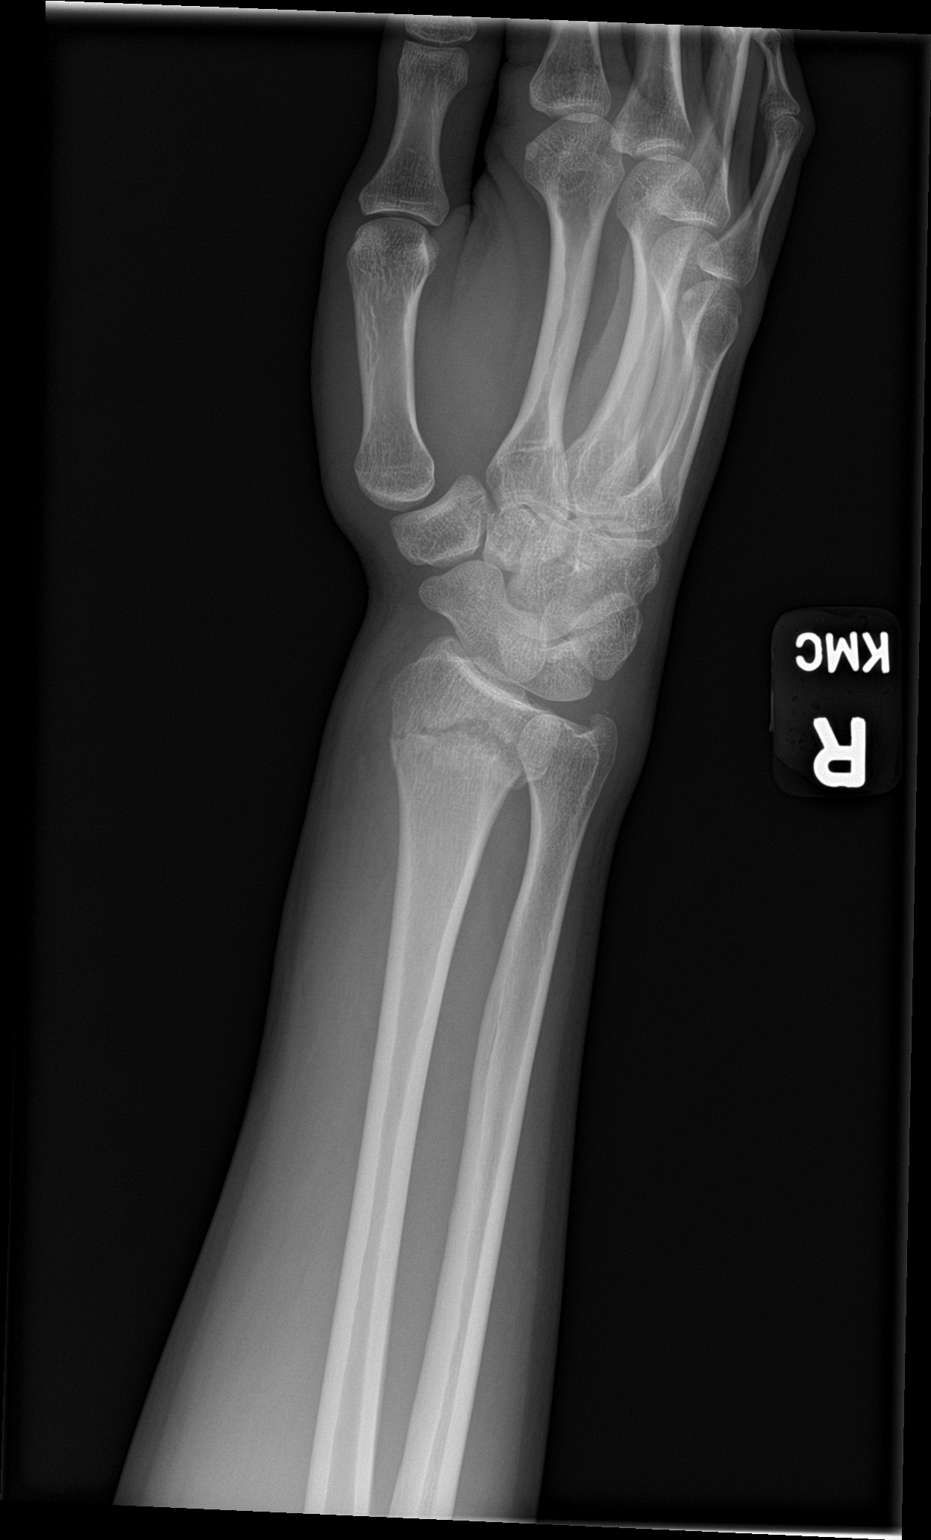

[wrist lat]
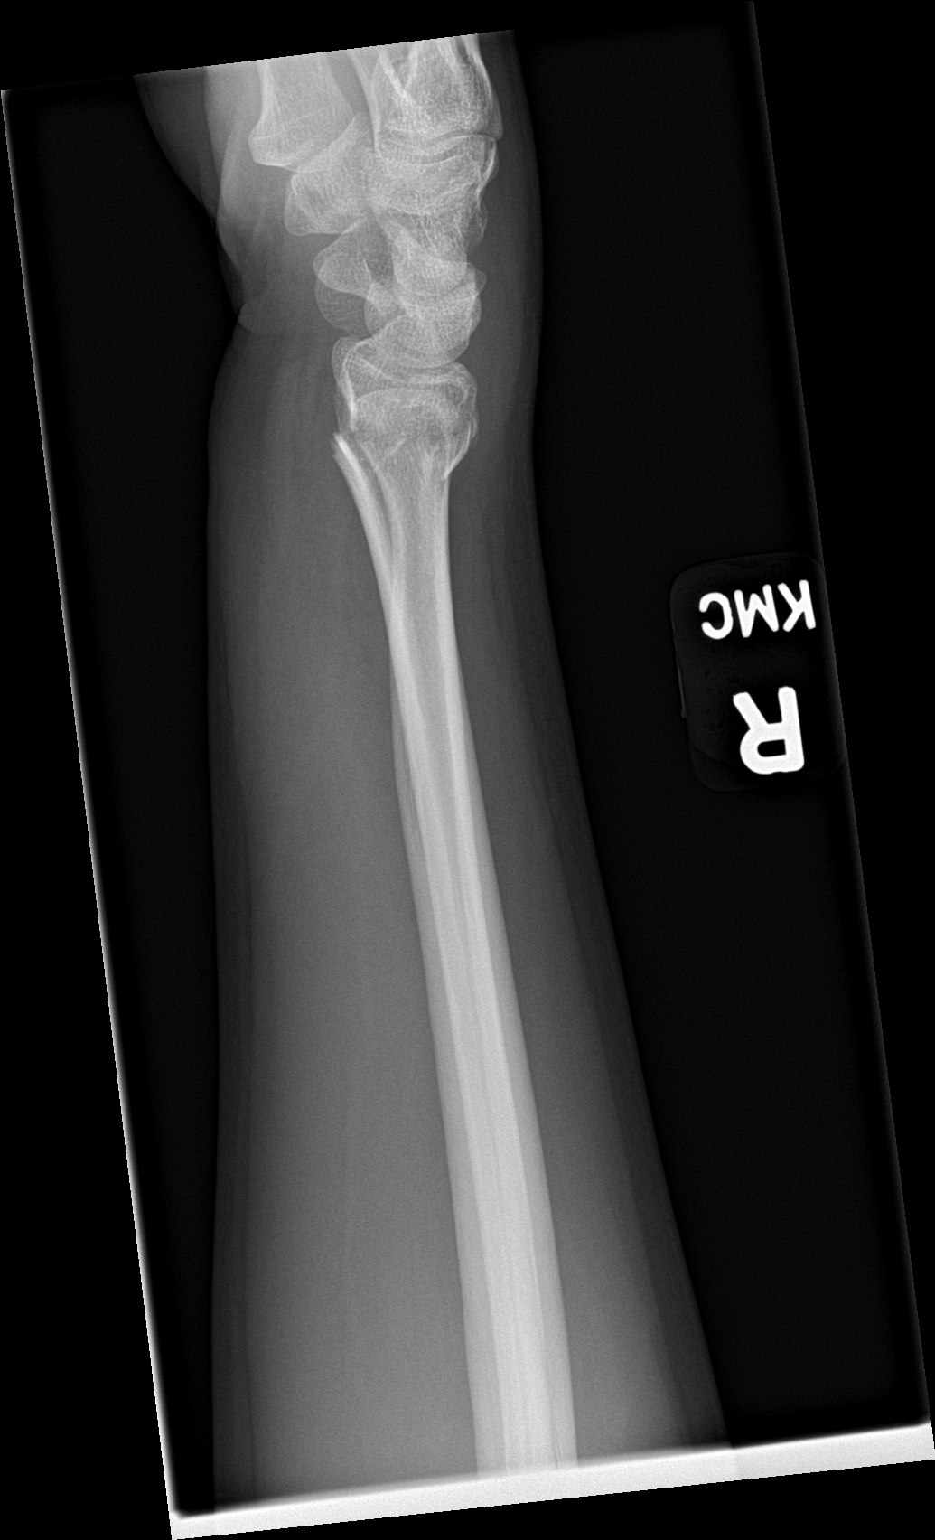

[wrist navicular]
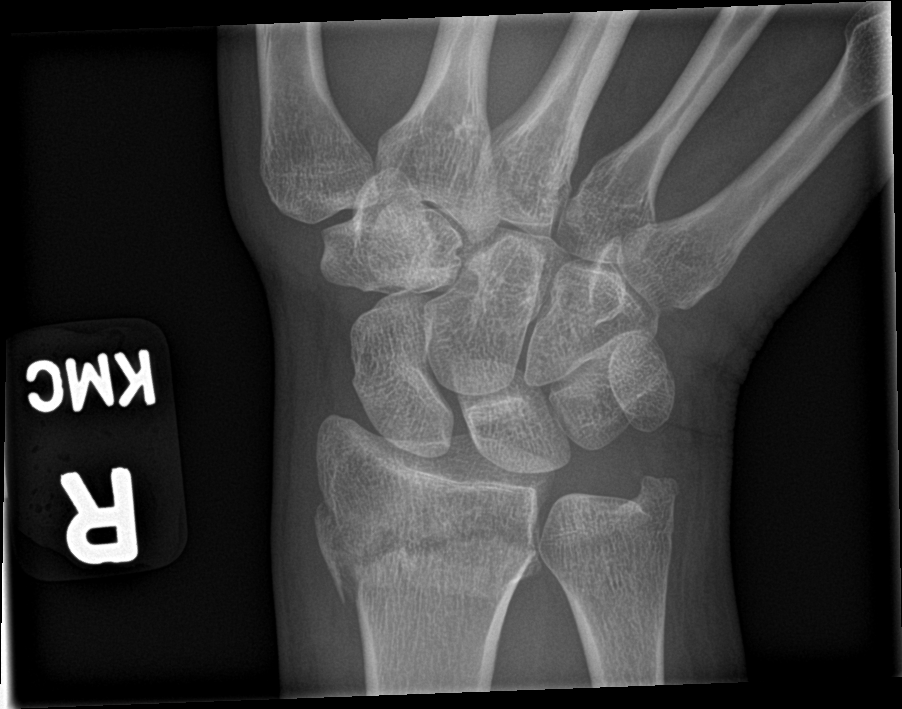

[4 of 4 positions shown; findings below may reference images not displayed]

FINDINGS: Mildly comminuted distal radius fracture, primarily transverse in
orientation. No convincing evidence of intra-articular extension.
Adjacent ulna intact. Normal scaphoid.
IMPRESSION: Distal radius fracture.

## 2022-05-17 ENCOUNTER — Emergency Department (HOSPITAL_BASED_OUTPATIENT_CLINIC_OR_DEPARTMENT_OTHER)
Admission: EM | Admit: 2022-05-17 | Discharge: 2022-05-17 | Disposition: A | Discharge status: Home / Self Care | Payer: BC Managed Care – PPO | Attending: Emergency Medicine | Admitting: Emergency Medicine

## 2022-05-17 ENCOUNTER — Encounter (HOSPITAL_BASED_OUTPATIENT_CLINIC_OR_DEPARTMENT_OTHER): Payer: Self-pay

## 2022-05-17 ENCOUNTER — Other Ambulatory Visit: Payer: Self-pay

## 2022-05-17 DIAGNOSIS — Z1152 Encounter for screening for COVID-19: Secondary | ICD-10-CM | POA: Insufficient documentation

## 2022-05-17 DIAGNOSIS — J101 Influenza due to other identified influenza virus with other respiratory manifestations: Secondary | ICD-10-CM | POA: Insufficient documentation

## 2022-05-17 LAB — HCG, QUANTITATIVE, PREGNANCY: hCG, Beta Chain, Quant, S: <1 m[IU]/mL (ref <5)

## 2022-05-17 LAB — CBC WITH DIFFERENTIAL/PLATELET
Abs Immature Granulocytes: 0.02 10*3/uL (ref 0.00–0.07)
Basophils Absolute: 0 10*3/uL (ref 0.0–0.1)
Basophils Relative: 0 %
Eosinophils Absolute: 0 10*3/uL (ref 0.0–0.5)
Eosinophils Relative: 0 %
HCT: 26.9 % — ABNORMAL LOW (ref 36.0–46.0)
Hemoglobin: 10 g/dL — ABNORMAL LOW (ref 12.0–15.0)
Immature Granulocytes: 0 %
Lymphocytes Relative: 10 %
Lymphs Abs: 0.5 10*3/uL — ABNORMAL LOW (ref 0.7–4.0)
MCH: 32.5 pg (ref 26.0–34.0)
MCHC: 37.2 g/dL — ABNORMAL HIGH (ref 30.0–36.0)
MCV: 87.3 fL (ref 80.0–100.0)
Monocytes Absolute: 0.5 10*3/uL (ref 0.1–1.0)
Monocytes Relative: 10 %
Neutro Abs: 3.9 10*3/uL (ref 1.7–7.7)
Neutrophils Relative %: 80 %
Platelets: 200 10*3/uL (ref 150–400)
RBC: 3.08 MIL/uL — ABNORMAL LOW (ref 3.87–5.11)
RDW: 14.1 % (ref 11.5–15.5)
Smear Review: NORMAL
WBC: 4.8 10*3/uL (ref 4.0–10.5)
nRBC: 0 % (ref 0.0–0.2)

## 2022-05-17 LAB — RESP PANEL BY RT-PCR (RSV, FLU A&B, COVID)  RVPGX2
Influenza A by PCR: NEGATIVE
Influenza B by PCR: POSITIVE — AB
Resp Syncytial Virus by PCR: NEGATIVE
SARS Coronavirus 2 by RT PCR: NEGATIVE

## 2022-05-17 LAB — COMPREHENSIVE METABOLIC PANEL
ALT: 63 U/L — ABNORMAL HIGH (ref 0–44)
AST: 50 U/L — ABNORMAL HIGH (ref 15–41)
Albumin: 4.2 g/dL (ref 3.5–5.0)
Alkaline Phosphatase: 45 U/L (ref 38–126)
Anion gap: 7 (ref 5–15)
BUN: 6 mg/dL (ref 6–20)
CO2: 24 mmol/L (ref 22–32)
Calcium: 8.2 mg/dL — ABNORMAL LOW (ref 8.9–10.3)
Chloride: 103 mmol/L (ref 98–111)
Creatinine, Ser: 0.62 mg/dL (ref 0.44–1.00)
GFR, Estimated: >60 mL/min (ref >60)
Glucose, Bld: 122 mg/dL — ABNORMAL HIGH (ref 70–99)
Potassium: 3.3 mmol/L — ABNORMAL LOW (ref 3.5–5.1)
Sodium: 134 mmol/L — ABNORMAL LOW (ref 135–145)
Total Bilirubin: 2.1 mg/dL — ABNORMAL HIGH (ref 0.3–1.2)
Total Protein: 7.1 g/dL (ref 6.5–8.1)

## 2022-05-17 MED ORDER — KETOROLAC TROMETHAMINE 15 MG/ML IJ SOLN
15.0000 mg | Freq: Once | INTRAMUSCULAR | Status: AC
  Administered 2022-05-17: 15 mg via INTRAVENOUS
  Filled 2022-05-17: qty 1

## 2022-05-17 MED ORDER — SODIUM CHLORIDE 0.9 % IV BOLUS
1000.0000 mL | Freq: Once | INTRAVENOUS | Status: AC
  Administered 2022-05-17: 1000 mL via INTRAVENOUS

## 2022-05-17 MED ORDER — ACETAMINOPHEN 325 MG PO TABS
650.0000 mg | ORAL_TABLET | Freq: Once | ORAL | Status: AC | PRN
  Administered 2022-05-17: 650 mg via ORAL
  Filled 2022-05-17: qty 2

## 2022-05-17 MED ORDER — ONDANSETRON HCL 4 MG/2ML IJ SOLN
4.0000 mg | Freq: Once | INTRAMUSCULAR | Status: AC
  Administered 2022-05-17: 4 mg via INTRAVENOUS
  Filled 2022-05-17: qty 2

## 2022-05-17 MED ORDER — BENZONATATE 100 MG PO CAPS: 100.0000 mg | ORAL_CAPSULE | Freq: Three times a day (TID) | ORAL | 0 refills | Status: DC

## 2022-05-17 MED ORDER — ONDANSETRON 4 MG PO TBDP
4.0000 mg | ORAL_TABLET | Freq: Once | ORAL | Status: AC | PRN
  Administered 2022-05-17: 4 mg via ORAL
  Filled 2022-05-17: qty 1

## 2022-05-17 MED ORDER — ONDANSETRON 4 MG PO TBDP: ORAL_TABLET | ORAL | 0 refills | Status: DC

## 2022-05-17 NOTE — ED Provider Notes (Signed)
[facility] Provider Note   CSN: [id number] Arrival date & time: 05/17/22  8185     History  Chief Complaint  Patient presents with   Fever   Emesis    Brandi Lane is a 27 y.o. female.  27 yo F with a cc of cough congestion, fevers chills myalgias.  Going on for the past four or five days.  No known sick contacts.  Has not really felt like eating and drinking.  Has had some vomiting over the past 24 to 48 hours.  Came here for evaluation.  Denies abdominal pain.  Denies urinary symptoms.   Fever Associated symptoms: vomiting   Emesis Associated symptoms: fever        Home Medications Prior to Admission medications   Medication Sig Start Date End Date Taking? Authorizing Provider  benzonatate (TESSALON) 100 MG capsule Take 1 capsule (100 mg total) by mouth every 8 (eight) hours. 05/17/22  Yes Deno Etienne, DO  ondansetron (ZOFRAN-ODT) 4 MG disintegrating tablet 4mg  ODT q4 hours prn nausea/vomit 05/17/22  Yes Deno Etienne, DO  folic acid (FOLVITE) 1 MG tablet Take 1 mg by mouth daily.    [provider]  levonorgestrel (LILETTA, 52 MG,) 20.1 MCG/DAY IUD 1 each by Intrauterine route once.    [provider]  tiZANidine (ZANAFLEX) 4 MG tablet Take 1 tablet (4 mg total) by mouth every 6 (six) hours as needed for muscle spasms. 11/12/21   Brunetta Jeans, PA-C      Allergies    Sulfa antibiotics and Sulfasalazine    Review of Systems   Review of Systems  Constitutional:  Positive for fever.  Gastrointestinal:  Positive for vomiting.    Physical Exam Updated Vital Signs BP 102/64 (BP Location: Left Arm)   Pulse 80   Temp (!) 100.8 F (38.2 C) (Oral)   Resp 18   Ht 5\' 7"  (1.702 m)   Wt 63.5 kg   SpO2 97%   BMI 21.93 kg/m  Physical Exam Vitals and nursing note reviewed.  Constitutional:      General: She is not in acute distress.    Appearance: She is well-developed. She is not diaphoretic.  HENT:      Head: Normocephalic and atraumatic.     Comments: Swollen turbinates, posterior nasal drip, no noted sinus ttp, tm normal bilaterally.   Eyes:     Pupils: Pupils are equal, round, and reactive to light.  Cardiovascular:     Rate and Rhythm: Normal rate and regular rhythm.     Heart sounds: No murmur heard.    No friction rub. No gallop.  Pulmonary:     Effort: Pulmonary effort is normal.     Breath sounds: No wheezing or rales.  Abdominal:     General: There is no distension.     Palpations: Abdomen is soft.     Tenderness: There is no abdominal tenderness.  Musculoskeletal:        General: No tenderness.     Cervical back: Normal range of motion and neck supple.  Skin:    General: Skin is warm and dry.  Neurological:     Mental Status: She is alert and oriented to person, place, and time.  Psychiatric:        Behavior: Behavior normal.     ED Results / Procedures / Treatments   Labs (all labs ordered are listed, but only abnormal results are displayed) Labs Reviewed  RESP  PANEL BY RT-PCR (RSV, FLU A&B, COVID)  RVPGX2 - Abnormal; Notable for the following components:      Result Value   Influenza B by PCR POSITIVE (*)    All other components within normal limits  CBC WITH DIFFERENTIAL/PLATELET - Abnormal; Notable for the following components:   RBC 3.08 (*)    Hemoglobin 10.0 (*)    HCT 26.9 (*)    MCHC 37.2 (*)    Lymphs Abs 0.5 (*)    All other components within normal limits  COMPREHENSIVE METABOLIC PANEL - Abnormal; Notable for the following components:   Sodium 134 (*)    Potassium 3.3 (*)    Glucose, Bld 122 (*)    Calcium 8.2 (*)    AST 50 (*)    ALT 63 (*)    Total Bilirubin 2.1 (*)    All other components within normal limits  HCG, QUANTITATIVE, PREGNANCY    EKG None  Radiology No results found.  Procedures Procedures    Medications Ordered in ED Medications  acetaminophen (TYLENOL) tablet 650 mg (650 mg Oral Given 05/17/22 0642)   ondansetron (ZOFRAN-ODT) disintegrating tablet 4 mg (4 mg Oral Given 05/17/22 0639)  sodium chloride 0.9 % bolus 1,000 mL (1,000 mLs Intravenous New Bag/Given 05/17/22 0743)  ketorolac (TORADOL) 15 MG/ML injection 15 mg (15 mg Intravenous Given 05/17/22 0751)  ondansetron (ZOFRAN) injection 4 mg (4 mg Intravenous Given 05/17/22 0751)    ED Course/ Medical Decision Making/ A&P                             Medical Decision Making Amount and/or Complexity of Data Reviewed Labs: ordered.  Risk OTC drugs. Prescription drug management.   27 yo F with a chief complaints of cough congestion fevers chills myalgias nausea vomiting going on for about 4 to 5 days now.  She is well-appearing nontoxic but with history of not being able to tolerate by mouth for the past 24 to 48 hours will give a bolus of IV fluids check lab work and attempt to treat her symptoms.   Patient's lab work most consistent with mild dehydration, trivial LFT elevation.  Total bilirubin up slightly.  No significant anemia.  No leukocytosis.  She is influenza B positive.  Will continue to treat supportively.  PCP follow-up.  8:45 AM:  I have discussed the diagnosis/risks/treatment options with the patient and family.  Evaluation and diagnostic testing in the emergency department does not suggest an emergent condition requiring admission or immediate intervention beyond what has been performed at this time.  They will follow up with PCP. We also discussed returning to the ED immediately if new or worsening sx occur. We discussed the sx which are most concerning (e.g., sudden worsening pain, fever, inability to tolerate by mouth) that necessitate immediate return. Medications administered to the patient during their visit and any new prescriptions provided to the patient are listed below.  Medications given during this visit Medications  acetaminophen (TYLENOL) tablet 650 mg (650 mg Oral Given 05/17/22 0642)  ondansetron (ZOFRAN-ODT)  disintegrating tablet 4 mg (4 mg Oral Given 05/17/22 0639)  sodium chloride 0.9 % bolus 1,000 mL (1,000 mLs Intravenous New Bag/Given 05/17/22 0743)  ketorolac (TORADOL) 15 MG/ML injection 15 mg (15 mg Intravenous Given 05/17/22 0751)  ondansetron (ZOFRAN) injection 4 mg (4 mg Intravenous Given 05/17/22 0751)     The patient appears reasonably screen and/or stabilized for discharge and I doubt  any other medical condition or other [facility] requiring further screening, evaluation, or treatment in the ED at this time prior to discharge.            Final Clinical Impression(s) / ED Diagnoses Final diagnoses:  Influenza B    Rx / DC Orders ED Discharge Orders          Ordered    ondansetron (ZOFRAN-ODT) 4 MG disintegrating tablet        05/17/22 0844    benzonatate (TESSALON) 100 MG capsule  Every 8 hours        05/17/22 [place], [place], DO 05/17/22 [phone]

## 2022-05-17 NOTE — Discharge Instructions (Signed)
Take tylenol 2 pills 4 times a day and motrin 4 pills 3 times a day.  Drink plenty of fluids.  Return for worsening shortness of breath, headache, confusion. Follow up with your family doctor.   

## 2022-05-17 NOTE — ED Triage Notes (Signed)
Pt here for continued fever and flu-like symptoms since Wednesday and vomiting since last night. Pt reports fever, body aches, nausea, productive cough, sob, and vomiting. Pt last took Tylenol last night at 1830, states max fever at home of 102.5. Denies sick contacts.

## 2022-05-17 NOTE — ED Notes (Signed)
Pt discharged to home. Discharge instructions have been discussed with patient and/or family members. Pt verbally acknowledges understanding d/c instructions, and endorses comprehension to checkout at registration before leaving.  °

## 2022-11-06 ENCOUNTER — Ambulatory Visit
Admission: EM | Admit: 2022-11-06 | Discharge: 2022-11-06 | Disposition: A | Discharge status: Home / Self Care | Payer: Self-pay | Attending: Internal Medicine | Admitting: Internal Medicine

## 2022-11-06 ENCOUNTER — Encounter: Payer: Self-pay | Admitting: Emergency Medicine

## 2022-11-06 ENCOUNTER — Ambulatory Visit (INDEPENDENT_AMBULATORY_CARE_PROVIDER_SITE_OTHER): Payer: Self-pay

## 2022-11-06 ENCOUNTER — Telehealth: Payer: Self-pay | Admitting: Emergency Medicine

## 2022-11-06 DIAGNOSIS — Z1152 Encounter for screening for COVID-19: Secondary | ICD-10-CM | POA: Insufficient documentation

## 2022-11-06 DIAGNOSIS — R051 Acute cough: Secondary | ICD-10-CM

## 2022-11-06 DIAGNOSIS — R509 Fever, unspecified: Secondary | ICD-10-CM

## 2022-11-06 MED ORDER — HYDROCOD POLI-CHLORPHE POLI ER 10-8 MG/5ML PO SUER: 5.0000 mL | Freq: Two times a day (BID) | ORAL | 0 refills | Status: DC | PRN

## 2022-11-06 MED ORDER — ALBUTEROL SULFATE (2.5 MG/3ML) 0.083% IN NEBU
2.5000 mg | INHALATION_SOLUTION | Freq: Once | RESPIRATORY_TRACT | Status: AC
  Administered 2022-11-06: 2.5 mg via RESPIRATORY_TRACT

## 2022-11-06 MED ORDER — ALBUTEROL SULFATE HFA 108 (90 BASE) MCG/ACT IN AERS: 2.0000 | INHALATION_SPRAY | RESPIRATORY_TRACT | 0 refills | Status: DC | PRN

## 2022-11-06 MED ORDER — METHYLPREDNISOLONE 4 MG PO TBPK: ORAL_TABLET | ORAL | 0 refills | Status: DC

## 2022-11-06 NOTE — ED Triage Notes (Signed)
Fever, cough and congestion and fever since Sunday.  Vomited this morning.  Cough productive at times.  Has been taking mucinex without relief.

## 2022-11-06 NOTE — Telephone Encounter (Signed)
Patient called and states [facility] did not receive the Medrol Dosepak.  Rx re-sent to pharmacy.

## 2022-11-06 NOTE — ED Provider Notes (Signed)
[facility]    CSN: [id number] Arrival date & time: 11/06/22  1510      History   Chief Complaint Chief Complaint  Patient presents with   Cough   Fever    HPI Brandi Lane is a 27 y.o. female who presents with onset of cough x 4 days, fever up to 101 today.  She vomited this am from the phlegm today. The other days have been around 99. Has been having sweats.  Her cough is productive at times. Has been taking mucinex and is not helping.  She denies rhinitis, ST.    Past Medical History:  Diagnosis Date   Anemia    History of blood transfusion 2013   Spherocytosis [facility])     Patient Active Problem List   Diagnosis Date Noted   LGSIL on Pap smear of cervix 09/23/2018   Deviated nasal septum 11/24/2015   FH: hemochromatosis 01/18/2013   Anemia 01/02/2012   Jaundice 01/02/2012   Hereditary spherocytosis (HCC) 07/14/2011    Past Surgical History:  Procedure Laterality Date   WISDOM TOOTH EXTRACTION      OB History     Gravida  1   Para  1   Term  1   Preterm      AB      Living  1      SAB      IAB      Ectopic      Multiple  0   Live Births  1            Home Medications    Prior to Admission medications   Medication Sig Start Date End Date Taking? Authorizing Provider  albuterol (VENTOLIN HFA) 108 (90 Base) MCG/ACT inhaler Inhale 2 puffs into the lungs every 4 (four) hours as needed for wheezing or shortness of breath. For cough attacks/ bronchospasm 11/06/22  Yes Rodriguez-Southworth, Nettie Elm, PA-C  chlorpheniramine-HYDROcodone (TUSSIONEX) 10-8 MG/5ML Take 5 mLs by mouth every 12 (twelve) hours as needed for cough. 11/06/22  Yes Rodriguez-Southworth, Nettie Elm, PA-C  folic acid (FOLVITE) 1 MG tablet Take 1 mg by mouth daily.    [provider]  levonorgestrel (LILETTA, 52 MG,) 20.1 MCG/DAY IUD 1 each by Intrauterine route once.    [provider]  methylPREDNISolone (MEDROL DOSEPAK) 4 MG TBPK tablet  Take as directed 11/06/22   Rodriguez-Southworth, Nettie Elm, PA-C    Family History History reviewed. No pertinent family history.  Social History Social History   Tobacco Use   Smoking status: Former    Current packs/day: 0.00    Types: Cigarettes    Quit date: 12/11/2013    Years since quitting: 8.9   Smokeless tobacco: Never  Vaping Use   Vaping status: Never Used  Substance Use Topics   Alcohol use: Not Currently    Comment: occasional    Drug use: No     Allergies   Sulfa antibiotics and Sulfasalazine   Review of Systems Review of Systems As noted in HPI  Physical Exam Triage Vital Signs ED Triage Vitals  Encounter Vitals Group     BP 11/06/22 1517 121/81     Systolic BP Percentile --      Diastolic BP Percentile --      Pulse Rate 11/06/22 1517 (!) 102     Resp 11/06/22 1517 18     Temp 11/06/22 1517 98.4 F (36.9 C)     Temp Source 11/06/22 1517 Oral     SpO2  11/06/22 1517 97 %     Weight --      Height --      Head Circumference --      Peak Flow --      Pain Score 11/06/22 1518 0     Pain Loc --      Pain Education --      Exclude from Growth Chart --    No data found.  Updated Vital Signs BP 121/81 (BP Location: Right Arm)   Pulse (!) 110   Temp 98.4 F (36.9 C) (Oral)   Resp 18   SpO2 98%   Visual Acuity Right Eye Distance:   Left Eye Distance:   Bilateral Distance:    Right Eye Near:   Left Eye Near:    Bilateral Near:     Physical Exam Physical Exam Constitutional:      General: He is not in acute distress.    Appearance: He is not toxic-appearing.  HENT:     Head: Normocephalic.     Right Ear: Tympanic membrane, ear canal and external ear normal.     Left Ear: Ear canal and external ear normal.     Nose: Nose normal.     Mouth/Throat:     Mouth: Mucous membranes are moist.     Pharynx: Oropharynx is clear.  Eyes:     General: No scleral icterus.    Conjunctiva/sclera: Conjunctivae normal.  Cardiovascular:     Rate and  Rhythm: Normal rate and regular rhythm.     Heart sounds: No murmur heard.   Pulmonary:     Effort: Pulmonary effort is normal. No respiratory distress.     Breath sounds: a faint Wheeze present on LLL, and after albuterol neb this was resolved. The cough attacks improved, but still had bouts of cough attacks.     Musculoskeletal:        General: Normal range of motion.     Cervical back: Neck supple.  Lymphadenopathy:     Cervical: No cervical adenopathy.  Skin:    General: Skin is warm and dry.     Findings: No rash.  Neurological:     Mental Status: He is alert and oriented to person, place, and time.     Gait: Gait normal.  Psychiatric:        Mood and Affect: Mood normal.        Behavior: Behavior normal.        Thought Content: Thought content normal.        Judgment: Judgment normal.    UC Treatments / Results  Labs (all labs ordered are listed, but only abnormal results are displayed) Labs Reviewed  SARS CORONAVIRUS 2 (TAT 6-24 HRS)    EKG   Radiology DG Chest 2 View  Addendum Date: 11/06/2022   ADDENDUM REPORT: 11/06/2022 16:26 ADDENDUM: Addendum after repeat radiographs obtained with nipple markers in place. The previously described nodular opacity in the right lower lobe corresponds with the nipple shadow. Final Impression: No acute cardiopulmonary abnormality. Electronically Signed   By: Jacob Moores M.D.   On: 11/06/2022 16:26   Result Date: 11/06/2022 CLINICAL DATA:  Cough attacks and fever EXAM: CHEST - 2 VIEW COMPARISON:  Chest x-ray August 03, 2016 FINDINGS: The heart size and mediastinal contours are within normal limits. There is focal nodular opacity in the right lower lobe, only seen on the AP view, favored to represent nipple shadow. No additional focal pulmonary opacity. No pleural effusion or pneumothorax.  The visualized upper abdomen is unremarkable. No acute osseous abnormality. IMPRESSION: 1. No definite acute cardiopulmonary abnormality. 2. There  is focal nodular opacity in the right lower lobe, only seen on the AP view, favored to represent nipple shadow. However, if there is persistent clinical concern, recommend repeat PA and lateral chest radiographs with nipple markers in place. Electronically Signed: By: Jacob Moores M.D. On: 11/06/2022 15:58    Procedures Procedures (including critical care time)  Medications Ordered in UC Medications  albuterol (PROVENTIL) (2.5 MG/3ML) 0.083% nebulizer solution 2.5 mg (2.5 mg Nebulization Given 11/06/22 1552)    Initial Impression / Assessment and Plan / UC Course  I have reviewed the triage vital signs and the nursing notes.  Pertinent  imaging results that were available during my care of the patient were reviewed by me and considered in my medical decision making (see chart for details).  Acute cough with bronchospasm  Covid test ordered and we will inform her of the results back In the mean time I placed her on Medrol and Albuterol inhaler and Tussionex as noted.    Final Clinical Impressions(s) / UC Diagnoses   Final diagnoses:  Acute cough  Fever, unspecified   Discharge Instructions   None    ED Prescriptions     Medication Sig Dispense Auth. Provider   chlorpheniramine-HYDROcodone (TUSSIONEX) 10-8 MG/5ML Take 5 mLs by mouth every 12 (twelve) hours as needed for cough. 115 mL Rodriguez-Southworth, Malia Corsi, PA-C   albuterol (VENTOLIN HFA) 108 (90 Base) MCG/ACT inhaler Inhale 2 puffs into the lungs every 4 (four) hours as needed for wheezing or shortness of breath. For cough attacks/ bronchospasm 18 g Rodriguez-Southworth, Sante Biedermann, PA-C   methylPREDNISolone (MEDROL DOSEPAK) 4 MG TBPK tablet Take as directed 21 tablet Rodriguez-Southworth, Nettie Elm, PA-C      I have reviewed the PDMP during this encounter.   Garey Ham, [place] 11/06/22 1802

## 2022-11-13 ENCOUNTER — Telehealth: Payer: Self-pay | Admitting: Emergency Medicine

## 2022-11-13 NOTE — Telephone Encounter (Signed)
Pt called and inquired about work note from 7/25 visit. Pt aware can only write note until 7/28. Pt verbalized understanding and reported would be by UC later today to pick up note. Work note left with patient access.

## 2023-03-26 ENCOUNTER — Ambulatory Visit: Payer: Self-pay | Admitting: Family Medicine

## 2023-04-02 ENCOUNTER — Ambulatory Visit: Payer: Self-pay | Admitting: Family Medicine

## 2023-04-30 ENCOUNTER — Other Ambulatory Visit (HOSPITAL_COMMUNITY)
Admission: RE | Admit: 2023-04-30 | Discharge: 2023-04-30 | Disposition: A | Discharge status: Home / Self Care | Payer: PRIVATE HEALTH INSURANCE | Source: Ambulatory Visit | Attending: Family Medicine | Admitting: Family Medicine

## 2023-04-30 ENCOUNTER — Ambulatory Visit: Payer: PRIVATE HEALTH INSURANCE | Admitting: Family Medicine

## 2023-04-30 ENCOUNTER — Encounter: Payer: Self-pay | Admitting: Family Medicine

## 2023-04-30 ENCOUNTER — Other Ambulatory Visit: Payer: Self-pay | Admitting: Family Medicine

## 2023-04-30 VITALS — BP 108/71 | HR 85 | Wt 148.0 lb

## 2023-04-30 DIAGNOSIS — N941 Unspecified dyspareunia: Secondary | ICD-10-CM

## 2023-04-30 DIAGNOSIS — Z01419 Encounter for gynecological examination (general) (routine) without abnormal findings: Secondary | ICD-10-CM

## 2023-04-30 DIAGNOSIS — D580 Hereditary spherocytosis: Secondary | ICD-10-CM

## 2023-04-30 DIAGNOSIS — Z1339 Encounter for screening examination for other mental health and behavioral disorders: Secondary | ICD-10-CM

## 2023-04-30 DIAGNOSIS — N6320 Unspecified lump in the left breast, unspecified quadrant: Secondary | ICD-10-CM

## 2023-04-30 DIAGNOSIS — N6321 Unspecified lump in the left breast, upper outer quadrant: Secondary | ICD-10-CM | POA: Diagnosis not present

## 2023-05-01 LAB — CBC
Hematocrit: 36.2 % (ref 34.0–46.6)
Hemoglobin: 12.4 g/dL (ref 11.1–15.9)
MCH: 33.5 pg — ABNORMAL HIGH (ref 26.6–33.0)
MCHC: 34.3 g/dL (ref 31.5–35.7)
MCV: 98 fL — ABNORMAL HIGH (ref 79–97)
Platelets: 410 10*3/uL (ref 150–450)
RBC: 3.7 x10E6/uL — ABNORMAL LOW (ref 3.77–5.28)
RDW: 16.4 % — ABNORMAL HIGH (ref 11.7–15.4)
WBC: 9.8 10*3/uL (ref 3.4–10.8)

## 2023-05-01 LAB — COMPREHENSIVE METABOLIC PANEL
ALT: 11 [IU]/L (ref 0–32)
AST: 17 [IU]/L (ref 0–40)
Albumin: 5.1 g/dL — ABNORMAL HIGH (ref 4.0–5.0)
Alkaline Phosphatase: 69 [IU]/L (ref 44–121)
BUN/Creatinine Ratio: 13 (ref 9–23)
BUN: 13 mg/dL (ref 6–20)
Bilirubin Total: 2.2 mg/dL — ABNORMAL HIGH (ref 0.0–1.2)
CO2: 22 mmol/L (ref 20–29)
Calcium: 10.2 mg/dL (ref 8.7–10.2)
Chloride: 103 mmol/L (ref 96–106)
Creatinine, Ser: 1.01 mg/dL — ABNORMAL HIGH (ref 0.57–1.00)
Globulin, Total: 3 g/dL (ref 1.5–4.5)
Glucose: 88 mg/dL (ref 70–99)
Potassium: 4.4 mmol/L (ref 3.5–5.2)
Sodium: 142 mmol/L (ref 134–144)
Total Protein: 8.1 g/dL (ref 6.0–8.5)
eGFR: 78 mL/min/{1.73_m2} (ref >59)

## 2023-05-01 LAB — TSH: TSH: 144 u[IU]/mL — ABNORMAL HIGH (ref 0.450–4.500)

## 2023-05-01 NOTE — Progress Notes (Signed)
ANNUAL EXAM Patient name: Brandi Lane MRN [id number]  Date of birth: [birth year] Chief Complaint:   Annual Exam  History of Present Illness:   Brandi Lane is a 28 y.o.  [id number]  female  being seen today for a routine annual exam.  Current complaints: Pain with sex for the past several months - maybe close to a year. Pain is on right side and center.  Pain is better in some positions.  She is not sure whether it is her IUD or something else.  No abnormal bleeding, discharge.  Will have a twinge of pain on the left side occasionally outside of sex.  Has been noticeably more fatigued recently.  Has not had spherocytosis labs checked in a year.  Does not currently have a hematologist.  Also noticed a lump in her left breast around the areola.  She noticed this about a week ago.  Lump is firm and tender.  Has family history of breast cancer - her dad's mom had ductal carcinoma  No LMP recorded. (Menstrual status: IUD).   Last pap 04/2021. Results were: NILM w/ HRHPV not done. H/O abnormal pap: yes 2020 - LSIL. Has had 2 negative PAPs. Last mammogram: n/a.     04/30/2023    2:07 PM  Depression screen PHQ 2/9  Decreased Interest 0  Down, Depressed, Hopeless 0  PHQ - 2 Score 0  Altered sleeping 1  Tired, decreased energy 2  Change in appetite 0  Feeling bad or failure about yourself  0  Trouble concentrating 0  Moving slowly or fidgety/restless 0  Suicidal thoughts 0  PHQ-9 Score 3        04/30/2023    2:07 PM  GAD 7 : Generalized Anxiety Score  Nervous, Anxious, on Edge 0  Control/stop worrying 0  Worry too much - different things 0  Trouble relaxing 1  Restless 0  Easily annoyed or irritable 0  Afraid - awful might happen 0  Total GAD 7 Score 1     Review of Systems:   Pertinent items are noted in HPI Denies any headaches, blurred vision, fatigue, shortness of breath, chest pain, abdominal pain, abnormal vaginal discharge/itching/odor/irritation,  problems with periods, bowel movements, urination, or intercourse unless otherwise stated above. Pertinent History Reviewed:  Reviewed past medical,surgical, social and family history.  Reviewed problem list, medications and allergies. Physical Assessment:   Vitals:   04/30/23 1314  BP: 108/71  Pulse: 85  Weight: 148 lb (67.1 kg)  Body mass index is 23.18 kg/m.        Physical Examination:   General appearance - well appearing, and in no distress  Mental status - alert, oriented to person, place, and time  Psych:  She has a normal mood and affect  Skin - warm and dry, normal color, no suspicious lesions noted  Chest - effort normal, all lung fields clear to auscultation bilaterally  Heart - normal rate and regular rhythm  Neck:  midline trachea, no thyromegaly or nodules  Breasts - breasts appear normal, there is a 1 cm firm nodule just above the areola at the left breast that is mobile, no skin or nipple changes or axillary nodes  Abdomen - soft, nontender, nondistended, no masses or organomegaly  Pelvic - VULVA: normal appearing vulva with no masses, tenderness or lesions  VAGINA: normal appearing vagina with normal color and discharge, no lesions  CERVIX: normal appearing cervix without discharge or lesions, no CMT  Thin prep pap is  not done   IUD strings present  UTERUS: uterus is felt to be normal size, shape, consistency and nontender   ADNEXA: No adnexal masses or tenderness noted.  Extremities:  No swelling or varicosities noted  Chaperone present for exam  Assessment & Plan:  1. Well woman exam with routine gynecological exam (Primary) Check CBC, CMP, TSH as part of annual blood work. - Cervicovaginal ancillary only - US PELVIC COMPLETE WITH TRANSVAGINAL; Future - [place] LIMITED ULTRASOUND INCLUDING AXILLA LEFT BREAST ; Future - Comprehensive metabolic panel - CBC - TSH  2. Breast lump on left side at 1 o'clock position Ultrasound  3. Hereditary spherocytosis  (HCC) Check CBC and CMP  4. Dyspareunia in female Will check ultrasound and vaginal culture - Cervicovaginal ancillary only - US PELVIC COMPLETE WITH TRANSVAGINAL; Future   Orders Placed This Encounter  Procedures   US PELVIC COMPLETE WITH TRANSVAGINAL   US LIMITED ULTRASOUND INCLUDING AXILLA LEFT BREAST    Comprehensive metabolic panel   CBC   TSH    Meds: No orders of the defined types were placed in this encounter.   Follow-up: No follow-ups on file.  Levie Heritage, DO 05/01/2023 10:01 AM

## 2023-05-04 ENCOUNTER — Ambulatory Visit (HOSPITAL_BASED_OUTPATIENT_CLINIC_OR_DEPARTMENT_OTHER)
Admission: RE | Admit: 2023-05-04 | Discharge: 2023-05-04 | Disposition: A | Discharge status: Home / Self Care | Payer: PRIVATE HEALTH INSURANCE | Source: Ambulatory Visit | Attending: Family Medicine | Admitting: Family Medicine

## 2023-05-04 ENCOUNTER — Other Ambulatory Visit: Payer: PRIVATE HEALTH INSURANCE

## 2023-05-04 DIAGNOSIS — R7989 Other specified abnormal findings of blood chemistry: Secondary | ICD-10-CM

## 2023-05-04 DIAGNOSIS — Z01419 Encounter for gynecological examination (general) (routine) without abnormal findings: Secondary | ICD-10-CM | POA: Diagnosis present

## 2023-05-04 DIAGNOSIS — N941 Unspecified dyspareunia: Secondary | ICD-10-CM | POA: Insufficient documentation

## 2023-05-04 LAB — CERVICOVAGINAL ANCILLARY ONLY
Bacterial Vaginitis (gardnerella): NEGATIVE
Candida Glabrata: NEGATIVE
Candida Vaginitis: NEGATIVE
Chlamydia: NEGATIVE
Comment: NEGATIVE
Comment: NEGATIVE
Comment: NEGATIVE
Comment: NEGATIVE
Comment: NEGATIVE
Comment: NORMAL
Neisseria Gonorrhea: NEGATIVE
Trichomonas: NEGATIVE

## 2023-05-04 NOTE — Progress Notes (Signed)
Patient sent to lab for blood draw. Armandina Stammer RN

## 2023-05-05 ENCOUNTER — Encounter: Payer: Self-pay | Admitting: Family Medicine

## 2023-05-05 ENCOUNTER — Other Ambulatory Visit: Payer: Self-pay | Admitting: Family Medicine

## 2023-05-05 DIAGNOSIS — E063 Autoimmune thyroiditis: Secondary | ICD-10-CM

## 2023-05-05 DIAGNOSIS — N83202 Unspecified ovarian cyst, left side: Secondary | ICD-10-CM

## 2023-05-05 LAB — THYROID PEROXIDASE ANTIBODY: Thyroperoxidase Ab SerPl-aCnc: >600 [IU]/mL — ABNORMAL HIGH (ref 0–34)

## 2023-05-05 LAB — T3, FREE: T3, Free: 1.9 pg/mL — ABNORMAL LOW (ref 2.0–4.4)

## 2023-05-05 LAB — T4: T4, Total: 2.6 ug/dL — ABNORMAL LOW (ref 4.5–12.0)

## 2023-05-05 MED ORDER — LEVOTHYROXINE SODIUM 112 MCG PO TABS: 112.0000 ug | ORAL_TABLET | Freq: Every day | ORAL | 3 refills | Status: DC

## 2023-05-05 NOTE — Progress Notes (Unsigned)
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 Will start on levothyroxine 1.6 mcg/kg/day, which is approximately 112 mcg per day. Will have follow up TSH in 8 weeks.  Discussed [place] results. Small left complex cyst. Repeat US in 8 weeks. IUD appears in correct orientation. No explanation of pain that she is feeling with sex. Options: wait to see if improves with starting levothyroxine (although I am not sure of the connection between the two), short course of antibiotics to reduce inflammation, remove the IUD and switch to something else. She would like to wait for the moment and will reach out if worsens.

## 2023-05-07 ENCOUNTER — Telehealth: Payer: Self-pay

## 2023-05-07 NOTE — Telephone Encounter (Signed)
Called patient and set up her repeat US and TSH level in 8 weeks per Dr. Adrian Blackwater.

## 2023-05-08 ENCOUNTER — Encounter: Payer: Self-pay | Admitting: Family Medicine

## 2023-05-08 ENCOUNTER — Other Ambulatory Visit: Payer: Self-pay | Admitting: Family Medicine

## 2023-05-08 ENCOUNTER — Telehealth: Payer: Self-pay | Admitting: Family Medicine

## 2023-05-08 NOTE — Telephone Encounter (Signed)
Called patient about breast US results. Has been set up with breast biopsy next week. Orders sent.   Levie Heritage, DO

## 2023-05-14 ENCOUNTER — Ambulatory Visit: Payer: Self-pay | Admitting: Family Medicine

## 2023-05-14 ENCOUNTER — Other Ambulatory Visit: Payer: Self-pay

## 2023-05-15 LAB — SURGICAL PATHOLOGY

## 2023-05-18 DIAGNOSIS — E063 Autoimmune thyroiditis: Secondary | ICD-10-CM | POA: Insufficient documentation

## 2023-06-02 ENCOUNTER — Telehealth: Payer: PRIVATE HEALTH INSURANCE | Admitting: Physician Assistant

## 2023-06-02 DIAGNOSIS — A084 Viral intestinal infection, unspecified: Secondary | ICD-10-CM

## 2023-06-02 MED ORDER — ONDANSETRON 4 MG PO TBDP: 4.0000 mg | ORAL_TABLET | Freq: Three times a day (TID) | ORAL | 0 refills | Status: DC | PRN

## 2023-06-02 NOTE — Patient Instructions (Signed)
Clementeen Graham, thank you for joining Margaretann Loveless, PA-C for today's virtual visit.  While this provider is not your primary care provider (PCP), if your PCP is located in our provider database this encounter information will be shared with them immediately following your visit.   A [facility] MyChart account gives you access to today's visit and all your visits, tests, and labs performed at [facility] " click here if you don't have a [facility] MyChart account or go to [email or web address]  Consent: (Patient) Clementeen Graham provided verbal consent for this virtual visit at the beginning of the encounter.  Current Medications:  Current Outpatient Medications:    ondansetron (ZOFRAN-ODT) 4 MG disintegrating tablet, Take 1 tablet (4 mg total) by mouth every 8 (eight) hours as needed., Disp: 20 tablet, Rfl: 0   albuterol (VENTOLIN HFA) 108 (90 Base) MCG/ACT inhaler, Inhale 2 puffs into the lungs every 4 (four) hours as needed for wheezing or shortness of breath. For cough attacks/ bronchospasm (Patient not taking: Reported on 04/30/2023), Disp: 18 g, Rfl: 0   chlorpheniramine-HYDROcodone (TUSSIONEX) 10-8 MG/5ML, Take 5 mLs by mouth every 12 (twelve) hours as needed for cough. (Patient not taking: Reported on 04/30/2023), Disp: 115 mL, Rfl: 0   folic acid (FOLVITE) 1 MG tablet, Take 1 mg by mouth daily. (Patient not taking: Reported on 04/30/2023), Disp: , Rfl:    levonorgestrel (LILETTA, 52 MG,) 20.1 MCG/DAY IUD, 1 each by Intrauterine route once., Disp: , Rfl:    levothyroxine (SYNTHROID) 112 MCG tablet, Take 1 tablet (112 mcg total) by mouth daily before breakfast., Disp: 90 tablet, Rfl: 3   methylPREDNISolone (MEDROL DOSEPAK) 4 MG TBPK tablet, Take as directed (Patient not taking: Reported on 04/30/2023), Disp: 21 tablet, Rfl: 0   Medications ordered in this encounter:  Meds ordered this encounter  Medications   ondansetron (ZOFRAN-ODT) 4 MG disintegrating  tablet    Sig: Take 1 tablet (4 mg total) by mouth every 8 (eight) hours as needed.    Dispense:  20 tablet    Refill:  0    Supervising Provider:   Merrilee Jansky [[id number]]     *If you need refills on other medications prior to your next appointment, please contact your pharmacy*  Follow-Up: Call back or seek an in-person evaluation if the symptoms worsen or if the condition fails to improve as anticipated.  [facility] [phone]  Other Instructions Viral Gastroenteritis, Adult  Viral gastroenteritis is also known as the stomach flu. This condition may affect your stomach, small intestine, and large intestine. It can cause sudden watery diarrhea, fever, and vomiting. This condition is caused by many different viruses. These viruses can be passed from person to person very easily (are contagious). Diarrhea and vomiting can make you feel weak and cause you to become dehydrated. You may not be able to keep fluids down. Dehydration can make you tired and thirsty, cause you to have a dry mouth, and decrease how often you urinate. It is important to replace the fluids that you lose from diarrhea and vomiting. What are the causes? Gastroenteritis is caused by many viruses, including rotavirus and norovirus. Norovirus is the most common cause in adults. You can get sick after being exposed to the viruses from other people. You can also get sick by: Eating food, drinking water, or touching a surface contaminated with one of these viruses. Sharing utensils or other personal items with an infected person. What increases the risk?  You are more likely to develop this condition if you: Have a weak body defense system (immune system). Live with one or more children who are younger than 2 years. Live in a nursing home. Travel on cruise ships. What are the signs or symptoms? Symptoms of this condition start suddenly 1-3 days after exposure to a virus. Symptoms may last for a few  days or for as long as a week. Common symptoms include watery diarrhea and vomiting. Other symptoms include: Fever. Headache. Fatigue. Pain in the abdomen. Chills. Weakness. Nausea. Muscle aches. Loss of appetite. How is this diagnosed? This condition is diagnosed with a medical history and physical exam. You may also have a stool test to check for viruses or other infections. How is this treated? This condition typically goes away on its own. The focus of treatment is to prevent dehydration and restore lost fluids (rehydration). This condition may be treated with: An oral rehydration solution (ORS) to replace important salts and minerals (electrolytes) in your body. Take this if told by your health care provider. This is a drink that is sold at pharmacies and retail stores. Medicines to help with your symptoms. Probiotic supplements to reduce symptoms of diarrhea. Fluids given through an IV, if dehydration is severe. Older adults and people with other diseases or a weak immune system are at higher risk for dehydration. Follow these instructions at home: Eating and drinking  Take an ORS as told by your health care provider. Drink clear fluids in small amounts as you are able. Clear fluids include: Water. Ice chips. Diluted fruit juice. Low-calorie sports drinks. Drink enough fluid to keep your urine pale yellow. Eat small amounts of healthy foods every 3-4 hours as you are able. This may include whole grains, fruits, vegetables, lean meats, and yogurt. Avoid fluids that contain a lot of sugar or caffeine, such as energy drinks, sports drinks, and soda. Avoid spicy or fatty foods. Avoid alcohol. General instructions  Wash your hands often, especially after having diarrhea or vomiting. If soap and water are not available, use hand sanitizer. Make sure that all people in your household wash their hands well and often. Take over-the-counter and prescription medicines only as told by  your health care provider. Rest at home while you recover. Watch your condition for any changes. Take a warm bath to relieve any burning or pain from frequent diarrhea episodes. Keep all follow-up visits. This is important. Contact a health care provider if you: Cannot keep fluids down. Have symptoms that get worse. Have new symptoms. Feel light-headed or dizzy. Have muscle cramps. Get help right away if you: Have chest pain. Have trouble breathing or you are breathing very quickly. Have a fast heartbeat. Feel extremely weak or you faint. Have a severe headache, a stiff neck, or both. Have a rash. Have severe pain, cramping, or bloating in your abdomen. Have skin that feels cold and clammy. Feel confused. Have pain when you urinate. Have signs of dehydration, such as: Dark urine, very little urine, or no urine. Cracked lips. Dry mouth. Sunken eyes. Sleepiness. Weakness. Have signs of bleeding, such as: Seeing blood in your vomit. Having vomit that looks like coffee grounds. Having bloody or black stools or stools that look like tar. These symptoms may be an emergency. Get help right away. Call 911. Do not wait to see if the symptoms will go away. Do not drive yourself to the hospital. Summary Viral gastroenteritis is also known as the stomach flu. It can cause  sudden watery diarrhea, fever, and vomiting. This condition can be passed from person to person very easily (is contagious). Take an oral rehydration solution (ORS) if told by your health care provider. This is a drink that is sold at pharmacies and retail stores. Wash your hands often, especially after having diarrhea or vomiting. If soap and water are not available, use hand sanitizer. This information is not intended to replace advice given to you by your health care provider. Make sure you discuss any questions you have with your health care provider. Document Revised: 01/28/2021 Document Reviewed:  01/28/2021 Elsevier Patient Education  2024 Elsevier Inc.   If you have been instructed to have an in-person evaluation today at a local Urgent Care facility, please use the link below. It will take you to a list of all of our available [facility], including address, phone number and hours of operation. Please do not delay care.  [facility]  If you or a family member do not have a primary care provider, use the link below to schedule a visit and establish care. When you choose a [facility] primary care physician or advanced practice provider, you gain a long-term partner in health. Find a Primary Care Provider  Learn more about [facility]'s in-office and virtual care options:  - Get Care Now

## 2023-06-02 NOTE — Progress Notes (Signed)
Virtual Visit Consent   Brandi Lane, you are scheduled for a virtual visit with a [facility] provider today. Just as with appointments in the office, your consent must be obtained to participate. Your consent will be active for this visit and any virtual visit you may have with one of our providers in the next 365 days. If you have a MyChart account, a copy of this consent can be sent to you electronically.  As this is a virtual visit, video technology does not allow for your provider to perform a traditional examination. This may limit your provider's ability to fully assess your condition. If your provider identifies any concerns that need to be evaluated in person or the need to arrange testing (such as labs, EKG, etc.), we will make arrangements to do so. Although advances in technology are sophisticated, we cannot ensure that it will always work on either your end or our end. If the connection with a video visit is poor, the visit may have to be switched to a telephone visit. With either a video or telephone visit, we are not always able to ensure that we have a secure connection.  By engaging in this virtual visit, you consent to the provision of healthcare and authorize for your insurance to be billed (if applicable) for the services provided during this visit. Depending on your insurance coverage, you may receive a charge related to this service.  I need to obtain your verbal consent now. Are you willing to proceed with your visit today? TYA HAUGHEY has provided verbal consent on 06/02/2023 for a virtual visit (video or telephone). Margaretann Loveless, PA-C  Date: 06/02/2023 6:16 PM   Virtual Visit via Video Note   I, Margaretann Loveless, connected with  Brandi Lane  ([id number], [birth year]) on 06/02/23 at  6:15 PM EST by a video-enabled telemedicine application and verified that I am speaking with the correct person using two identifiers.  Location: Patient:  Virtual Visit Location Patient: Home Provider: Virtual Visit Location Provider: Home Office   I discussed the limitations of evaluation and management by telemedicine and the availability of in person appointments. The patient expressed understanding and agreed to proceed.    History of Present Illness: Brandi Lane is a 28 y.o. who identifies as a female who was assigned female at birth, and is being seen today for nausea and vomiting.  HPI: Emesis  This is a new problem. The current episode started in the past 7 days (2 days; Started Monday early morning with vomitng). The problem has been gradually worsening. The emesis has an appearance of stomach contents. Maximum temperature: subjective fevers, chills then host flashes. The fever has been present for 1 to 2 days. Associated symptoms include chills, a fever (subjective) and headaches. Pertinent negatives include no chest pain, coughing, diarrhea, myalgias, sweats or URI. Risk factors include ill contacts. She has tried increased fluids for the symptoms. The treatment provided no relief.     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 For  cough attacks/ bronchospasm (Patient not taking: Reported on 04/30/2023), Disp: 18 g, Rfl: 0   chlorpheniramine-HYDROcodone (TUSSIONEX) 10-8 MG/5ML, Take 5 mLs by mouth every 12 (twelve) hours as needed for cough. (Patient not taking: Reported on 04/30/2023), Disp: 115 mL, Rfl: 0   folic acid (FOLVITE) 1 MG tablet, Take 1 mg by mouth daily. (Patient not  taking: Reported on 04/30/2023), Disp: , Rfl:    levonorgestrel (LILETTA, 52 MG,) 20.1 MCG/DAY IUD, 1 each by Intrauterine route once., Disp: , Rfl:    levothyroxine (SYNTHROID) 112 MCG tablet, Take 1 tablet (112 mcg total) by mouth daily before breakfast., Disp: 90 tablet, Rfl: 3   methylPREDNISolone (MEDROL DOSEPAK) 4 MG TBPK tablet, Take as directed (Patient not taking: Reported on 04/30/2023), Disp: 21 tablet, Rfl: 0   ondansetron (ZOFRAN-ODT) 4 MG disintegrating tablet, Take 1 tablet (4 mg total) by mouth every 8 (eight) hours as needed., Disp: 20 tablet, Rfl: 0  Observations/Objective: Patient is well-developed, well-nourished in no acute distress.  Resting comfortably at home.  Head is normocephalic, atraumatic.  No labored breathing.  Speech is clear and coherent with logical content.  Patient is alert and oriented at baseline.    Assessment and Plan: 1. Viral gastroenteritis (Primary) - ondansetron (ZOFRAN-ODT) 4 MG disintegrating tablet; Take 1 tablet (4 mg total) by mouth every 8 (eight) hours as needed.  Dispense: 20 tablet; Refill: 0  - Suspect viral gastroenteritis - Zofran for nausea - Push fluids, electrolyte beverages - Liquid diet, then increase to soft/bland (BRAT) diet over next day, then increase diet as tolerated - Seek in person evaluation if not improving or symptoms worsen   Follow Up Instructions: I discussed the assessment and treatment plan with the patient. The patient was provided an opportunity to ask questions and all were answered. The patient agreed with the plan and demonstrated an understanding of the instructions.  A copy of instructions were sent to the patient via MyChart unless otherwise noted below.    The patient was advised to call back or seek an in-person evaluation if the symptoms worsen or if the condition fails to improve as anticipated.    Margaretann Loveless, PA-C

## 2023-06-29 ENCOUNTER — Ambulatory Visit (HOSPITAL_BASED_OUTPATIENT_CLINIC_OR_DEPARTMENT_OTHER)
Admission: RE | Admit: 2023-06-29 | Discharge: 2023-06-29 | Disposition: A | Discharge status: Home / Self Care | Payer: PRIVATE HEALTH INSURANCE | Source: Ambulatory Visit | Attending: Family Medicine | Admitting: Family Medicine

## 2023-06-29 ENCOUNTER — Other Ambulatory Visit: Payer: PRIVATE HEALTH INSURANCE

## 2023-06-29 ENCOUNTER — Other Ambulatory Visit (HOSPITAL_BASED_OUTPATIENT_CLINIC_OR_DEPARTMENT_OTHER): Payer: PRIVATE HEALTH INSURANCE

## 2023-06-29 DIAGNOSIS — N83202 Unspecified ovarian cyst, left side: Secondary | ICD-10-CM | POA: Diagnosis present

## 2023-06-29 DIAGNOSIS — E063 Autoimmune thyroiditis: Secondary | ICD-10-CM

## 2023-06-29 MED ORDER — IOHEXOL 300 MG/ML  SOLN: 75.0000 mL | Freq: Once | INTRAMUSCULAR | Status: DC | PRN

## 2023-06-29 NOTE — Progress Notes (Signed)
 Patient presents for repeat TSH. Patient was sent to the lab to have lab drawn.  Shreya Lacasse l Rebeccah Ivins, CMA

## 2023-06-30 ENCOUNTER — Encounter: Payer: Self-pay | Admitting: Family Medicine

## 2023-06-30 ENCOUNTER — Other Ambulatory Visit: Payer: Self-pay | Admitting: Family Medicine

## 2023-06-30 LAB — TSH: TSH: 0.249 u[IU]/mL — ABNORMAL LOW (ref 0.450–4.500)

## 2023-07-02 MED ORDER — LEVOTHYROXINE SODIUM 100 MCG PO TABS: 100.0000 ug | ORAL_TABLET | Freq: Every day | ORAL | 3 refills | Status: DC

## 2023-07-09 ENCOUNTER — Telehealth: Payer: Self-pay

## 2023-07-09 NOTE — Telephone Encounter (Signed)
 Patient called to inquire about her ultrasound results from March 17th, 2025. Informed patient that I would forward the information to Dr. Adrian Blackwater for him to look at the ultrasound.

## 2023-07-09 NOTE — Telephone Encounter (Signed)
 I looked at [place] - it appears that complex cyst on left ovary has resolved. IUD in appropriate placement. Still waiting for radiologist read. I discussed preliminary results with patient. Fatigue better to some degree, but still frequently tired. Trying to get in to primary care - waiting for call back.

## 2023-08-17 ENCOUNTER — Other Ambulatory Visit: Payer: PRIVATE HEALTH INSURANCE

## 2023-08-20 ENCOUNTER — Other Ambulatory Visit: Payer: PRIVATE HEALTH INSURANCE

## 2023-08-20 DIAGNOSIS — E063 Autoimmune thyroiditis: Secondary | ICD-10-CM

## 2023-08-20 NOTE — Progress Notes (Signed)
 Pt at the front office pick up lab order for TSH.

## 2023-08-21 ENCOUNTER — Encounter: Payer: Self-pay | Admitting: Family Medicine

## 2023-08-21 LAB — TSH: TSH: 5.66 u[IU]/mL — ABNORMAL HIGH (ref 0.450–4.500)

## 2023-08-21 MED ORDER — LEVOTHYROXINE SODIUM 112 MCG PO TABS: 112.0000 ug | ORAL_TABLET | Freq: Every day | ORAL | 3 refills | Status: DC

## 2023-08-21 NOTE — Addendum Note (Signed)
 Addended by: Malka Sea on: 08/21/2023 10:49 AM   Modules accepted: Orders

## 2023-09-09 ENCOUNTER — Ambulatory Visit: Payer: Self-pay

## 2023-09-15 ENCOUNTER — Telehealth: Payer: PRIVATE HEALTH INSURANCE | Admitting: Nurse Practitioner

## 2023-09-15 DIAGNOSIS — J3489 Other specified disorders of nose and nasal sinuses: Secondary | ICD-10-CM | POA: Insufficient documentation

## 2023-09-15 DIAGNOSIS — R3989 Other symptoms and signs involving the genitourinary system: Secondary | ICD-10-CM

## 2023-09-15 MED ORDER — CEPHALEXIN 500 MG PO CAPS: 500.0000 mg | ORAL_CAPSULE | Freq: Two times a day (BID) | ORAL | 0 refills | Status: AC

## 2023-09-15 NOTE — Progress Notes (Signed)
 Virtual Visit Consent   Ed B Eckersley, you are scheduled for a virtual visit with a [facility] provider today. Just as with appointments in the office, your consent must be obtained to participate. Your consent will be active for this visit and any virtual visit you may have with one of our providers in the next 365 days. If you have a MyChart account, a copy of this consent can be sent to you electronically.  As this is a virtual visit, video technology does not allow for your provider to perform a traditional examination. This may limit your provider's ability to fully assess your condition. If your provider identifies any concerns that need to be evaluated in person or the need to arrange testing (such as labs, EKG, etc.), we will make arrangements to do so. Although advances in technology are sophisticated, we cannot ensure that it will always work on either your end or our end. If the connection with a video visit is poor, the visit may have to be switched to a telephone visit. With either a video or telephone visit, we are not always able to ensure that we have a secure connection.  By engaging in this virtual visit, you consent to the provision of healthcare and authorize for your insurance to be billed (if applicable) for the services provided during this visit. Depending on your insurance coverage, you may receive a charge related to this service.  I need to obtain your verbal consent now. Are you willing to proceed with your visit today? Dyanara B Hammar has provided verbal consent on 09/15/2023 for a virtual visit (video or telephone). Mardene Shake, FNP  Date: 09/15/2023 4:01 PM   Virtual Visit via Video Note   I, Mardene Shake, connected with  SIRENITY SHEW  ([id number], [birth year]) on 09/15/23 at  4:00 PM EDT by a video-enabled telemedicine application and verified that I am speaking with the correct person using two identifiers.  Location: Patient: Virtual Visit  Location Patient: Home Provider: Virtual Visit Location Provider: Home Office   I discussed the limitations of evaluation and management by telemedicine and the availability of in person appointments. The patient expressed understanding and agreed to proceed.    History of Present Illness: Brandi Lane is a 28 y.o. who identifies as a female who was assigned female at birth, and is being seen today for symptoms of urinary frequency and urgency for the past 2 days, she started to have burning today.  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   Sprain of left wrist 08/11/2018   Left wrist pain 08/10/2018   Deviated nasal septum 11/24/2015   Nasal turbinate hypertrophy 11/24/2015   Primary snoring 11/24/2015   FH: hemochromatosis 01/18/2013   Anemia 01/02/2012   Jaundice 01/02/2012   Abdominal pain 01/02/2012   Nausea and vomiting 01/02/2012   Hereditary spherocytosis (HCC) 07/14/2011    Allergies:  Allergies  Allergen Reactions   Sulfa Antibiotics Other (See Comments)    Mother and grandmother are allergic  Family is allergic, pt has never had it but was told to never take it    Mother and grandmother are allergic   Medications:  Current Outpatient Medications:    folic acid (FOLVITE) 1 MG  tablet, Take 1 mg by mouth daily. (Patient not taking: Reported on 04/30/2023), Disp: , Rfl:    levonorgestrel  (LILETTA , 52 MG,) 20.1 MCG/DAY IUD, 1 each by Intrauterine route once., Disp: , Rfl:    levothyroxine  (SYNTHROID ) 112 MCG tablet, Take 1 tablet (112 mcg total) by mouth daily before breakfast., Disp: 90 tablet, Rfl: 3   ondansetron  (ZOFRAN -ODT) 4 MG disintegrating tablet, Take 1 tablet (4 mg total) by mouth every 8 (eight) hours as needed., Disp: 20 tablet,  Rfl: 0  Observations/Objective: Patient is well-developed, well-nourished in no acute distress.  Resting comfortably  at home.  Head is normocephalic, atraumatic.  No labored breathing.  Speech is clear and coherent with logical content.  Patient is alert and oriented at baseline.    Assessment and Plan:  1. Suspected UTI  Push fluids and follow up as needed   Meds ordered this encounter  Medications   cephALEXin (KEFLEX) 500 MG capsule    Sig: Take 1 capsule (500 mg total) by mouth 2 (two) times daily for 7 days.    Dispense:  14 capsule    Refill:  0     Follow Up Instructions: I discussed the assessment and treatment plan with the patient. The patient was provided an opportunity to ask questions and all were answered. The patient agreed with the plan and demonstrated an understanding of the instructions.  A copy of instructions were sent to the patient via MyChart unless otherwise noted below.    The patient was advised to call back or seek an in-person evaluation if the symptoms worsen or if the condition fails to improve as anticipated.    Mardene Shake, FNP

## 2023-10-13 ENCOUNTER — Other Ambulatory Visit: Payer: PRIVATE HEALTH INSURANCE

## 2023-10-13 DIAGNOSIS — E063 Autoimmune thyroiditis: Secondary | ICD-10-CM

## 2023-10-13 NOTE — Progress Notes (Signed)
 Patient here to pick lab requisition.  Sent to lab.  Brandi Lane

## 2023-10-14 LAB — TSH: TSH: 20.1 u[IU]/mL — ABNORMAL HIGH (ref 0.450–4.500)

## 2023-10-15 ENCOUNTER — Telehealth: Payer: Self-pay | Admitting: Family Medicine

## 2023-10-15 ENCOUNTER — Ambulatory Visit: Payer: Self-pay | Admitting: Family Medicine

## 2023-10-15 MED ORDER — SERTRALINE HCL 50 MG PO TABS
50.0000 mg | ORAL_TABLET | Freq: Every day | ORAL | 3 refills | Status: AC
Start: 2023-10-15 — End: ?

## 2023-10-15 MED ORDER — SYNTHROID 137 MCG PO TABS: 137.0000 ug | ORAL_TABLET | Freq: Every day | ORAL | 3 refills | Status: DC

## 2023-10-15 NOTE — Telephone Encounter (Signed)
 Patient with results.  She has been taking her levothyroxine  daily and feels a little sluggish working and going to school.  She overall feels much better than when her hypothyroidism was first discovered.  Will increase levothyroxine  to 137 and changed to brand name to see if this impacts her thyroid  level.  We will also have her come in and get T3 and T4 levels checked to see if she needs to be prescribed Cytomel in addition to the Synthroid .  If she continues to be unstable, will refer to endocrinology

## 2023-10-19 NOTE — Telephone Encounter (Signed)
-----   Message from Lang JINNY Peel sent at 10/15/2023  5:42 PM EDT ----- Can you have her come in for free T3 and T4 levels? ----- Message ----- From: Delores Nidia CROME, FNP Sent: 10/14/2023   8:27 AM EDT To: Jacob J Stinson, DO  Sending this to you, as it looks like you've been managing her thyroid  ----- Message ----- From: Rebecka Memos Lab Results In Sent: 10/14/2023   7:37 AM EDT To: Nidia CROME Delores, FNP

## 2023-10-19 NOTE — Telephone Encounter (Signed)
 Left voicemail for patient to call back and schedule a lab appointment.

## 2023-10-22 ENCOUNTER — Other Ambulatory Visit: Payer: PRIVATE HEALTH INSURANCE

## 2023-10-22 DIAGNOSIS — E063 Autoimmune thyroiditis: Secondary | ICD-10-CM

## 2023-10-22 NOTE — Progress Notes (Signed)
 Patient presents for T3 and T4 free labs. Patient was sent to the lab to have labs drawn. Dallie Patton l Bevelyn Arriola, CMA

## 2023-10-23 LAB — T4, FREE: Free T4: 1.34 ng/dL (ref 0.82–1.77)

## 2023-10-23 LAB — T3, FREE: T3, Free: 3.4 pg/mL (ref 2.0–4.4)

## 2023-10-27 ENCOUNTER — Ambulatory Visit: Payer: Self-pay | Admitting: Family Medicine

## 2023-10-30 MED ORDER — SYNTHROID 150 MCG PO TABS: 150.0000 ug | ORAL_TABLET | Freq: Every day | ORAL | 3 refills | Status: AC

## 2023-12-07 NOTE — Telephone Encounter (Signed)
-----   Message from Lang JINNY Peel sent at 10/30/2023  2:14 PM EDT ----- Needs [place] in 6 weeks for repeat TSH with free T3 and T4  ----- Message ----- From: Interface, [facility] Lab Results In Sent: 10/23/2023   5:36 AM EDT To: Jacob J Stinson, DO

## 2023-12-07 NOTE — Telephone Encounter (Signed)
 Called patient to schedule an appointment for repeat labs. Left message for patient to call the office back.  Brandi Lane l Estes Lehner, CMA

## 2023-12-09 ENCOUNTER — Telehealth: Payer: PRIVATE HEALTH INSURANCE | Admitting: Physician Assistant

## 2023-12-09 DIAGNOSIS — J019 Acute sinusitis, unspecified: Secondary | ICD-10-CM

## 2023-12-09 DIAGNOSIS — B9689 Other specified bacterial agents as the cause of diseases classified elsewhere: Secondary | ICD-10-CM

## 2023-12-09 DIAGNOSIS — H938X3 Other specified disorders of ear, bilateral: Secondary | ICD-10-CM

## 2023-12-09 MED ORDER — FLUTICASONE PROPIONATE 50 MCG/ACT NA SUSP: 2.0000 | Freq: Every day | NASAL | 0 refills | Status: AC

## 2023-12-09 MED ORDER — AMOXICILLIN-POT CLAVULANATE 875-125 MG PO TABS: 1.0000 | ORAL_TABLET | Freq: Two times a day (BID) | ORAL | 0 refills | Status: DC

## 2023-12-09 NOTE — Progress Notes (Signed)
 Virtual Visit Consent   Brandi Lane, you are scheduled for a virtual visit with a [facility] provider today. Just as with appointments in the office, your consent must be obtained to participate. Your consent will be active for this visit and any virtual visit you may have with one of our providers in the next 365 days. If you have a MyChart account, a copy of this consent can be sent to you electronically.  As this is a virtual visit, video technology does not allow for your provider to perform a traditional examination. This may limit your provider's ability to fully assess your condition. If your provider identifies any concerns that need to be evaluated in person or the need to arrange testing (such as labs, EKG, etc.), we will make arrangements to do so. Although advances in technology are sophisticated, we cannot ensure that it will always work on either your end or our end. If the connection with a video visit is poor, the visit may have to be switched to a telephone visit. With either a video or telephone visit, we are not always able to ensure that we have a secure connection.  By engaging in this virtual visit, you consent to the provision of healthcare and authorize for your insurance to be billed (if applicable) for the services provided during this visit. Depending on your insurance coverage, you may receive a charge related to this service.  I need to obtain your verbal consent now. Are you willing to proceed with your visit today? [name] has provided verbal consent on 12/09/2023 for a virtual visit (video or telephone). Brandi Lane, [place]  Date: 12/09/2023 11:11 AM   Virtual Visit via Video Note   I, Brandi Lane, connected with  Brandi Lane  ([id number], January 20, 1996) on 12/09/23 at 11:00 AM EDT by a video-enabled telemedicine application and verified that I am speaking with the correct person using two identifiers.  Location: Patient:  Virtual Visit Location Patient: Home Provider: Virtual Visit Location Provider: Home Office   I discussed the limitations of evaluation and management by telemedicine and the availability of in person appointments. The patient expressed understanding and agreed to proceed.    History of Present Illness: Brandi Lane is a 28 y.o. who identifies as a female who was assigned female at birth, and is being seen today for concern for bacterial URI/ear infection. Endorses symptoms starting Friday of last week. Daughter was sick and thinks she caught what she had Started with nasal congestion, drainage and cough.Now over past few days with more substantial sinus pain and bilateral ear pressure/pain. Denies fever, chills but notes fatigue.  At home COVID test was negative.   HPI: HPI  Problems:  Patient Active Problem List   Diagnosis Date Noted   Nasal congestion with rhinorrhea 09/15/2023   Hashimoto's disease 05/18/2023   LGSIL on Pap smear of cervix 09/23/2018   Sprain of left wrist 08/11/2018   Left wrist pain 08/10/2018   Deviated nasal septum 11/24/2015   Nasal turbinate hypertrophy 11/24/2015   Primary snoring 11/24/2015   FH: hemochromatosis 01/18/2013   Anemia 01/02/2012   Jaundice 01/02/2012   Abdominal pain 01/02/2012   Nausea and vomiting 01/02/2012   Hereditary spherocytosis (HCC) 07/14/2011    Allergies:  Allergies  Allergen Reactions   Sulfa Antibiotics Other (See Comments)    Mother and grandmother are allergic  Family is allergic, pt has never had it but was told to never take it  Mother and grandmother are allergic   Medications:  Current Outpatient Medications:    amoxicillin -clavulanate (AUGMENTIN ) 875-125 MG tablet, Take 1 tablet by mouth 2 (two) times daily., Disp: 14 tablet, Rfl: 0   fluticasone  (FLONASE ) 50 MCG/ACT nasal spray, Place 2 sprays into both nostrils daily., Disp: 16 g, Rfl: 0   folic acid (FOLVITE) 1 MG tablet, Take 1 mg by mouth  daily. (Patient not taking: Reported on 04/30/2023), Disp: , Rfl:    levonorgestrel  (LILETTA , 52 MG,) 20.1 MCG/DAY IUD, 1 each by Intrauterine route once., Disp: , Rfl:    sertraline  (ZOLOFT ) 50 MG tablet, Take 1 tablet (50 mg total) by mouth daily., Disp: 90 tablet, Rfl: 3   SYNTHROID  150 MCG tablet, Take 1 tablet (150 mcg total) by mouth daily before breakfast., Disp: 90 tablet, Rfl: 3  Observations/Objective: Patient is well-developed, well-nourished in no acute distress.  Resting comfortably at home.  Head is normocephalic, atraumatic.  No labored breathing. Speech is clear and coherent with logical content.  Patient is alert and oriented at baseline.   Assessment and Plan: 1. Acute bacterial sinusitis (Primary) - amoxicillin -clavulanate (AUGMENTIN ) 875-125 MG tablet; Take 1 tablet by mouth 2 (two) times daily.  Dispense: 14 tablet; Refill: 0 - fluticasone  (FLONASE ) 50 MCG/ACT nasal spray; Place 2 sprays into both nostrils daily.  Dispense: 16 g; Refill: 0  2. Ear pressure, bilateral - fluticasone  (FLONASE ) 50 MCG/ACT nasal spray; Place 2 sprays into both nostrils daily.  Dispense: 16 g; Refill: 0  Rx Augmentin  and Flonase .  Increase fluids.  Rest.  Saline nasal spray.  Probiotic.  Mucinex as directed.  Humidifier in bedroom.  Call or return to clinic if symptoms are not improving.   Follow Up Instructions: I discussed the assessment and treatment plan with the patient. The patient was provided an opportunity to ask questions and all were answered. The patient agreed with the plan and demonstrated an understanding of the instructions.  A copy of instructions were sent to the patient via MyChart unless otherwise noted below.   The patient was advised to call back or seek an in-person evaluation if the symptoms worsen or if the condition fails to improve as anticipated.    Brandi Velma Lunger, PA-C

## 2023-12-09 NOTE — Patient Instructions (Signed)
 Brandi Lane, thank you for joining Brandi Velma Lunger, PA-C for today's virtual visit.  While this provider is not your primary care provider (PCP), if your PCP is located in our provider database this encounter information will be shared with them immediately following your visit.   A [facility] MyChart account gives you access to today's visit and all your visits, tests, and labs performed at [facility]  click here if you don't have a [facility] MyChart account or go to [email or web address]  Consent: (Patient) Brandi Lane provided verbal consent for this virtual visit at the beginning of the encounter.  Current Medications:  Current Outpatient Medications:    amoxicillin -clavulanate (AUGMENTIN ) 875-125 MG tablet, Take 1 tablet by mouth 2 (two) times daily., Disp: 14 tablet, Rfl: 0   fluticasone  (FLONASE ) 50 MCG/ACT nasal spray, Place 2 sprays into both nostrils daily., Disp: 16 g, Rfl: 0   folic acid (FOLVITE) 1 MG tablet, Take 1 mg by mouth daily. (Patient not taking: Reported on 04/30/2023), Disp: , Rfl:    levonorgestrel  (LILETTA , 52 MG,) 20.1 MCG/DAY IUD, 1 each by Intrauterine route once., Disp: , Rfl:    sertraline  (ZOLOFT ) 50 MG tablet, Take 1 tablet (50 mg total) by mouth daily., Disp: 90 tablet, Rfl: 3   SYNTHROID  150 MCG tablet, Take 1 tablet (150 mcg total) by mouth daily before breakfast., Disp: 90 tablet, Rfl: 3   Medications ordered in this encounter:  Meds ordered this encounter  Medications   amoxicillin -clavulanate (AUGMENTIN ) 875-125 MG tablet    Sig: Take 1 tablet by mouth 2 (two) times daily.    Dispense:  14 tablet    Refill:  0    Supervising Provider:   LAMPTEY, PHILIP Lane [[id number]]   fluticasone  (FLONASE ) 50 MCG/ACT nasal spray    Sig: Place 2 sprays into both nostrils daily.    Dispense:  16 g    Refill:  0    Supervising Provider:   BLAISE ALEENE Lane [[id number]]     *If you need refills on other medications prior to  your next appointment, please contact your pharmacy*  Follow-Up: Call back or seek an in-person evaluation if the symptoms worsen or if the condition fails to improve as anticipated.  [facility] [phone]  Other Instructions Please take antibiotic as directed.  Increase fluid intake.  Use Saline nasal spray.  Take a daily multivitamin. Use the Flonase  as directed. You can start OTC Mucinex-DM to help with congestion and wet cough. AS cough is drying out, you can switch to OTC Delsym.  Place a humidifier in the bedroom.  Please call or return clinic if symptoms are not improving.  Sinusitis Sinusitis is redness, soreness, and swelling (inflammation) of the paranasal sinuses. Paranasal sinuses are air pockets within the bones of your face (beneath the eyes, the middle of the forehead, or above the eyes). In healthy paranasal sinuses, mucus is able to drain out, and air is able to circulate through them by way of your nose. However, when your paranasal sinuses are inflamed, mucus and air can become trapped. This can allow bacteria and other germs to grow and cause infection. Sinusitis can develop quickly and last only a short time (acute) or continue over a long period (chronic). Sinusitis that lasts for more than 12 weeks is considered chronic.  CAUSES  Causes of sinusitis include: Allergies. Structural abnormalities, such as displacement of the cartilage that separates your nostrils (deviated septum), which can decrease the air  flow through your nose and sinuses and affect sinus drainage. Functional abnormalities, such as when the small hairs (cilia) that line your sinuses and help remove mucus do not work properly or are not present. SYMPTOMS  Symptoms of acute and chronic sinusitis are the same. The primary symptoms are pain and pressure around the affected sinuses. Other symptoms include: Upper toothache. Earache. Headache. Bad breath. Decreased sense of smell and  taste. A cough, which worsens when you are lying flat. Fatigue. Fever. Thick drainage from your nose, which often is green and may contain pus (purulent). Swelling and warmth over the affected sinuses. DIAGNOSIS  Your caregiver will perform a physical exam. During the exam, your caregiver may: Look in your nose for signs of abnormal growths in your nostrils (nasal polyps). Tap over the affected sinus to check for signs of infection. View the inside of your sinuses (endoscopy) with a special imaging device with a light attached (endoscope), which is inserted into your sinuses. If your caregiver suspects that you have chronic sinusitis, one or more of the following tests may be recommended: Allergy tests. Nasal culture A sample of mucus is taken from your nose and sent to a lab and screened for bacteria. Nasal cytology A sample of mucus is taken from your nose and examined by your caregiver to determine if your sinusitis is related to an allergy. TREATMENT  Most cases of acute sinusitis are related to a viral infection and will resolve on their own within 10 days. Sometimes medicines are prescribed to help relieve symptoms (pain medicine, decongestants, nasal steroid sprays, or saline sprays).  However, for sinusitis related to a bacterial infection, your caregiver will prescribe antibiotic medicines. These are medicines that will help kill the bacteria causing the infection.  Rarely, sinusitis is caused by a fungal infection. In theses cases, your caregiver will prescribe antifungal medicine. For some cases of chronic sinusitis, surgery is needed. Generally, these are cases in which sinusitis recurs more than 3 times per year, despite other treatments. HOME CARE INSTRUCTIONS  Drink plenty of water. Water helps thin the mucus so your sinuses can drain more easily. Use a humidifier. Inhale steam 3 to 4 times a day (for example, sit in the bathroom with the shower running). Apply a warm, moist  washcloth to your face 3 to 4 times a day, or as directed by your caregiver. Use saline nasal sprays to help moisten and clean your sinuses. Take over-the-counter or prescription medicines for pain, discomfort, or fever only as directed by your caregiver. SEEK IMMEDIATE MEDICAL CARE IF: You have increasing pain or severe headaches. You have nausea, vomiting, or drowsiness. You have swelling around your face. You have vision problems. You have a stiff neck. You have difficulty breathing. MAKE SURE YOU:  Understand these instructions. Will watch your condition. Will get help right away if you are not doing well or get worse. Document Released: 03/31/2005 Document Revised: 06/23/2011 Document Reviewed: 04/15/2011 [facility] Patient Information 2014 [place], [place].    If you have been instructed to have an in-person evaluation today at a local Urgent Care facility, please use the link below. It will take you to a list of all of our available [facility], including address, phone number and hours of operation. Please do not delay care.  [facility]  If you or a family member do not have a primary care provider, use the link below to schedule a visit and establish care. When you choose a [facility] primary care physician or advanced practice provider, you gain a long-term partner in health. Find a Primary Care Provider  Learn more about [facility] in-office and virtual care options: [facility] - Get Care Now

## 2023-12-15 ENCOUNTER — Other Ambulatory Visit: Payer: PRIVATE HEALTH INSURANCE

## 2023-12-21 ENCOUNTER — Other Ambulatory Visit: Payer: PRIVATE HEALTH INSURANCE

## 2023-12-22 ENCOUNTER — Other Ambulatory Visit: Payer: PRIVATE HEALTH INSURANCE

## 2023-12-22 DIAGNOSIS — R7989 Other specified abnormal findings of blood chemistry: Secondary | ICD-10-CM

## 2023-12-23 ENCOUNTER — Ambulatory Visit: Payer: Self-pay | Admitting: Family Medicine

## 2023-12-23 LAB — T4, FREE: Free T4: 1.24 ng/dL (ref 0.82–1.77)

## 2023-12-23 LAB — TSH: TSH: 3.29 u[IU]/mL (ref 0.450–4.500)

## 2023-12-23 LAB — T3, FREE: T3, Free: 2.7 pg/mL (ref 2.0–4.4)

## 2024-01-04 ENCOUNTER — Telehealth: Payer: PRIVATE HEALTH INSURANCE | Admitting: Physician Assistant

## 2024-01-04 DIAGNOSIS — H1031 Unspecified acute conjunctivitis, right eye: Secondary | ICD-10-CM

## 2024-01-04 MED ORDER — POLYMYXIN B-TRIMETHOPRIM 10000-0.1 UNIT/ML-% OP SOLN: OPHTHALMIC | 0 refills | Status: AC

## 2024-01-04 NOTE — Progress Notes (Signed)
 I have spent 5 minutes in review of e-visit questionnaire, review and updating patient chart, medical decision making and response to patient.   Elsie Velma Lunger, PA-C

## 2024-01-04 NOTE — Progress Notes (Signed)

## 2024-03-04 ENCOUNTER — Telehealth: Payer: PRIVATE HEALTH INSURANCE | Admitting: Family Medicine

## 2024-03-04 DIAGNOSIS — J019 Acute sinusitis, unspecified: Secondary | ICD-10-CM

## 2024-03-04 DIAGNOSIS — B9689 Other specified bacterial agents as the cause of diseases classified elsewhere: Secondary | ICD-10-CM | POA: Diagnosis not present

## 2024-03-04 MED ORDER — AMOXICILLIN-POT CLAVULANATE 875-125 MG PO TABS: 1.0000 | ORAL_TABLET | Freq: Two times a day (BID) | ORAL | 0 refills | Status: AC

## 2024-03-04 NOTE — Patient Instructions (Signed)

## 2024-03-04 NOTE — Progress Notes (Signed)
 Virtual Visit Consent   Brandi Lane, you are scheduled for a virtual visit with a [facility] provider today. Just as with appointments in the office, your consent must be obtained to participate. Your consent will be active for this visit and any virtual visit you may have with one of our providers in the next 365 days. If you have a MyChart account, a copy of this consent can be sent to you electronically.  As this is a virtual visit, video technology does not allow for your provider to perform a traditional examination. This may limit your provider's ability to fully assess your condition. If your provider identifies any concerns that need to be evaluated in person or the need to arrange testing (such as labs, EKG, etc.), we will make arrangements to do so. Although advances in technology are sophisticated, we cannot ensure that it will always work on either your end or our end. If the connection with a video visit is poor, the visit may have to be switched to a telephone visit. With either a video or telephone visit, we are not always able to ensure that we have a secure connection.  By engaging in this virtual visit, you consent to the provision of healthcare and authorize for your insurance to be billed (if applicable) for the services provided during this visit. Depending on your insurance coverage, you may receive a charge related to this service.  I need to obtain your verbal consent now. Are you willing to proceed with your visit today? Brandi Lane has provided verbal consent on 03/04/2024 for a virtual visit (video or telephone). Brandi Lamp, FNP  Date: 03/04/2024 6:02 PM   Virtual Visit via Video Note   I, Brandi Lane, connected with  Brandi Lane  ([id number], [birth year]) on 03/04/24 at  6:00 PM EST by a video-enabled telemedicine application and verified that I am speaking with the correct person using two identifiers.  Location: Patient: Virtual Visit  Location Patient: Home Provider: Virtual Visit Location Provider: Home Office   I discussed the limitations of evaluation and management by telemedicine and the availability of in person appointments. The patient expressed understanding and agreed to proceed.    History of Present Illness: Brandi Lane is a 28 y.o. who identifies as a female who was assigned female at birth, and is being seen today for sinus pain and pressure, post nasal drainage, green thick mucus, minimal cough, sx for 1o days. 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   Sprain of left wrist 08/11/2018   Left wrist pain 08/10/2018   Deviated nasal septum 11/24/2015   Nasal turbinate hypertrophy 11/24/2015   Primary snoring 11/24/2015   FH: hemochromatosis 01/18/2013   Anemia 01/02/2012   Jaundice 01/02/2012   Abdominal pain 01/02/2012   Nausea and vomiting 01/02/2012   Hereditary spherocytosis 07/14/2011    Allergies:  Allergies  Allergen Reactions   Sulfa Antibiotics Other (See Comments)    Mother and grandmother are allergic  Family is allergic, pt has never had it but was told to never take it    Mother and grandmother are allergic   Medications:  Current Outpatient Medications:    fluticasone  (FLONASE ) 50 MCG/ACT nasal spray, Place 2 sprays into both nostrils daily., Disp: 16 g, Rfl: 0   folic acid (FOLVITE)  1 MG tablet, Take 1 mg by mouth daily. (Patient not taking: Reported on 04/30/2023), Disp: , Rfl:    levonorgestrel  (LILETTA , 52 MG,) 20.1 MCG/DAY IUD, 1 each by Intrauterine route once., Disp: , Rfl:    sertraline  (ZOLOFT ) 50 MG tablet, Take 1 tablet (50 mg total) by mouth daily., Disp: 90 tablet, Rfl: 3   SYNTHROID  150 MCG tablet, Take 1 tablet (150 mcg total) by mouth daily before breakfast., Disp: 90 tablet, Rfl: 3    trimethoprim -polymyxin b  (POLYTRIM ) ophthalmic solution, Apply 1-2 drops into affected eye QID x 5 days., Disp: 10 mL, Rfl: 0  Observations/Objective: Patient is well-developed, well-nourished in no acute distress.  Resting comfortably  at home.  Head is normocephalic, atraumatic.  No labored breathing.  Speech is clear and coherent with logical content.  Patient is alert and oriented at baseline.    Assessment and Plan: 1. Acute bacterial sinusitis (Primary)  Increase fluids, humidifier at night, tylenol  or ibuprofen , flonase  UC as needed.   Follow Up Instructions: I discussed the assessment and treatment plan with the patient. The patient was provided an opportunity to ask questions and all were answered. The patient agreed with the plan and demonstrated an understanding of the instructions.  A copy of instructions were sent to the patient via MyChart unless otherwise noted below.     The patient was advised to call back or seek an in-person evaluation if the symptoms worsen or if the condition fails to improve as anticipated.    Dekayla Prestridge, FNP

## 2024-04-25 ENCOUNTER — Encounter: Payer: Self-pay | Admitting: Family Medicine
